# Patient Record
Sex: Female | Born: 1960 | Race: Asian | Hispanic: No | Marital: Married | Smoking: Never smoker
Health system: Southern US, Community
[De-identification: ages and names within clinical notes are randomized; demographics above are authoritative.]

## PROBLEM LIST (undated history)

## (undated) DIAGNOSIS — Z86010 Personal history of colonic polyps: Secondary | ICD-10-CM

## (undated) DIAGNOSIS — I10 Essential (primary) hypertension: Secondary | ICD-10-CM

## (undated) DIAGNOSIS — K219 Gastro-esophageal reflux disease without esophagitis: Secondary | ICD-10-CM

## (undated) DIAGNOSIS — E785 Hyperlipidemia, unspecified: Secondary | ICD-10-CM

## (undated) HISTORY — PX: EYE SURGERY: SHX253

## (undated) HISTORY — PX: BREAST SURGERY: SHX581

## (undated) HISTORY — PX: NOSE SURGERY: SHX723

## (undated) HISTORY — PX: TUBAL LIGATION: SHX77

## (undated) HISTORY — DX: Personal history of colonic polyps: Z86.010

## (undated) HISTORY — DX: Hyperlipidemia, unspecified: E78.5

## (undated) HISTORY — DX: Gastro-esophageal reflux disease without esophagitis: K21.9

## (undated) HISTORY — DX: Essential (primary) hypertension: I10

---

## 2000-05-13 ENCOUNTER — Encounter (INDEPENDENT_AMBULATORY_CARE_PROVIDER_SITE_OTHER): Payer: Self-pay | Admitting: Specialist

## 2000-05-13 ENCOUNTER — Observation Stay (HOSPITAL_COMMUNITY): Admission: RE | Admit: 2000-05-13 | Discharge: 2000-05-14 | Payer: Self-pay | Admitting: Gynecology

## 2000-11-12 ENCOUNTER — Other Ambulatory Visit: Admission: RE | Admit: 2000-11-12 | Discharge: 2000-11-12 | Payer: Self-pay | Admitting: Gynecology

## 2005-08-01 ENCOUNTER — Other Ambulatory Visit: Admission: RE | Admit: 2005-08-01 | Discharge: 2005-08-01 | Payer: Self-pay | Admitting: Gynecology

## 2005-08-15 ENCOUNTER — Encounter: Admission: RE | Admit: 2005-08-15 | Discharge: 2005-08-15 | Payer: Self-pay | Admitting: Gynecology

## 2006-07-01 ENCOUNTER — Emergency Department (HOSPITAL_COMMUNITY): Admission: EM | Admit: 2006-07-01 | Discharge: 2006-07-01 | Payer: Self-pay | Admitting: Emergency Medicine

## 2006-11-30 ENCOUNTER — Emergency Department (HOSPITAL_COMMUNITY): Admission: EM | Admit: 2006-11-30 | Discharge: 2006-11-30 | Payer: Self-pay | Admitting: Emergency Medicine

## 2007-04-17 ENCOUNTER — Other Ambulatory Visit: Admission: RE | Admit: 2007-04-17 | Discharge: 2007-04-17 | Payer: Self-pay | Admitting: Gynecology

## 2008-07-02 IMAGING — CT CT HEAD W/O CM
1 series · 16 of 30 positions shown, 20 images · IV contrast (agent unspecified)
Comparison: None.

CLINICAL DATA: 45-year-old female, right-sided headaches.  
 HEAD CT WITHOUT CONTRAST:
TECHNIQUE: Contiguous axial images were obtained from the base of the skull through the vertex according to standard protocol without contrast.

[Series 2: head_seq 4.5 h45s st · axial · 0.43mm/px · z∈[-111,+33]mm · 16 of 36 slices shown, 20 images]
[im 2/36  brain]
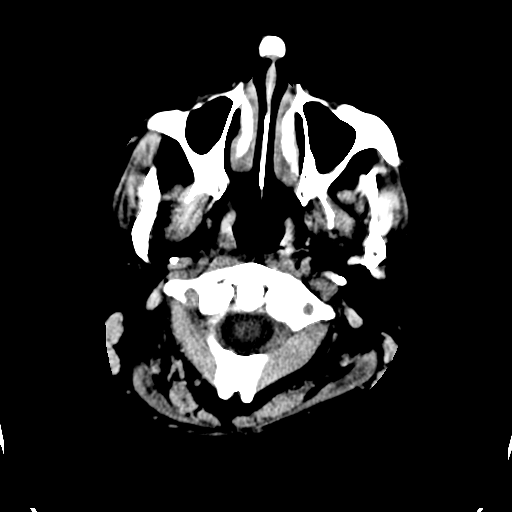
[im 2/36  bone]
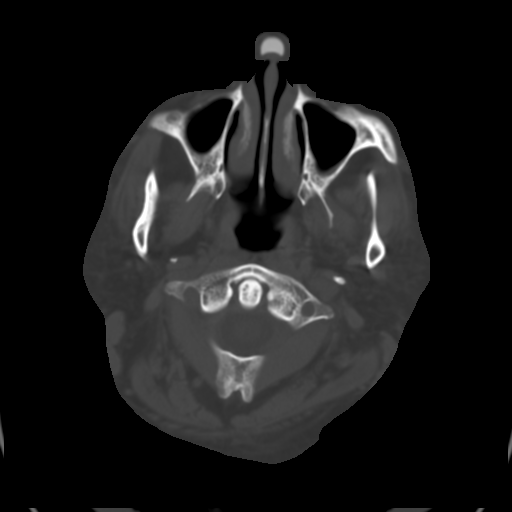
[im 4/36  brain]
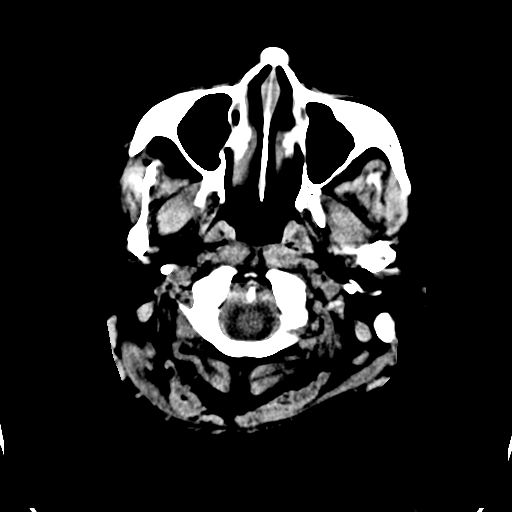
[im 7/36  brain]
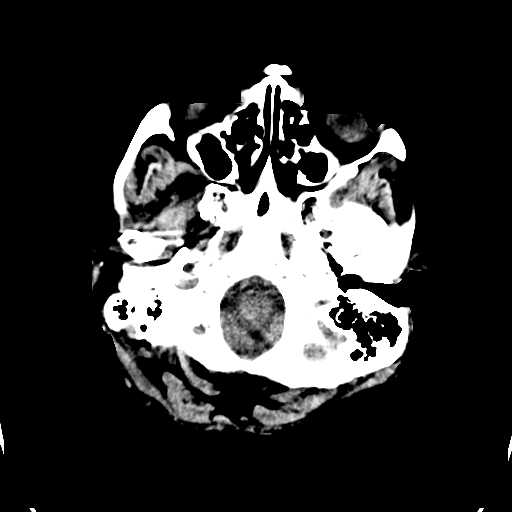
[im 9/36  brain]
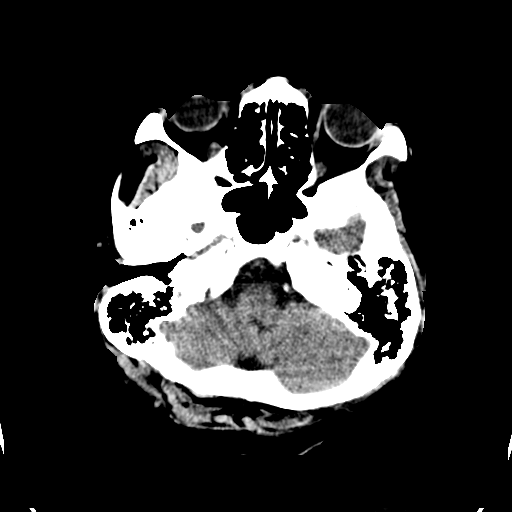
[im 10/36  brain]
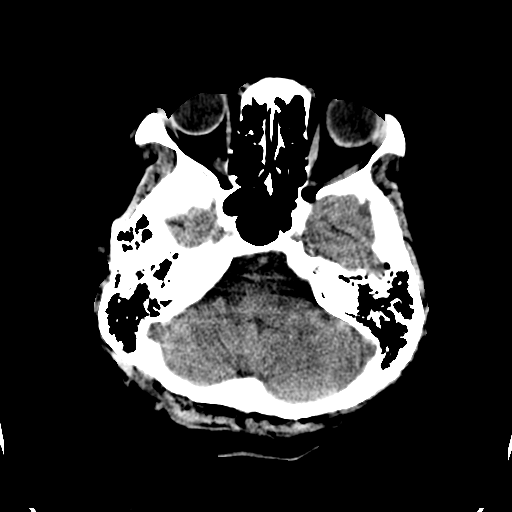
[im 10/36  bone]
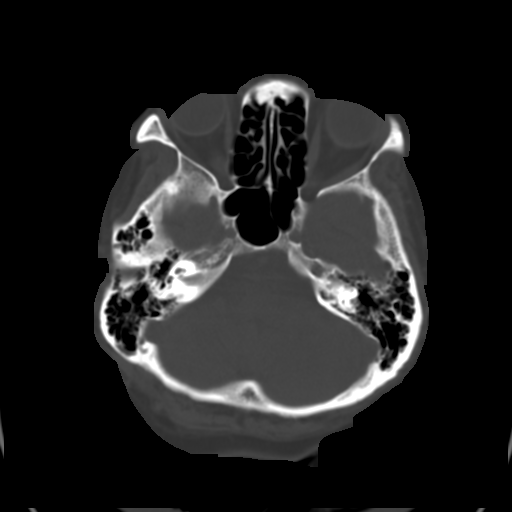
[im 13/36  brain]
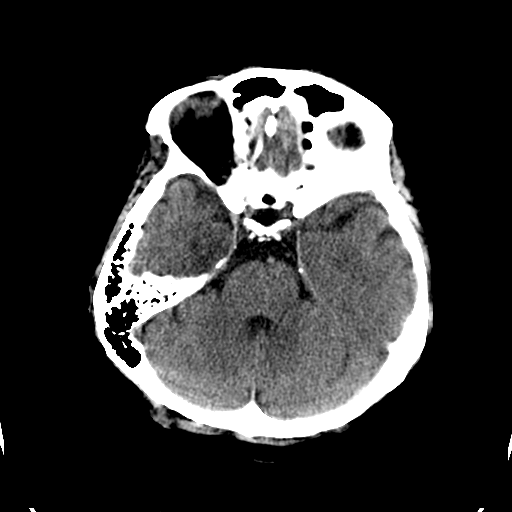
[im 15/36  brain]
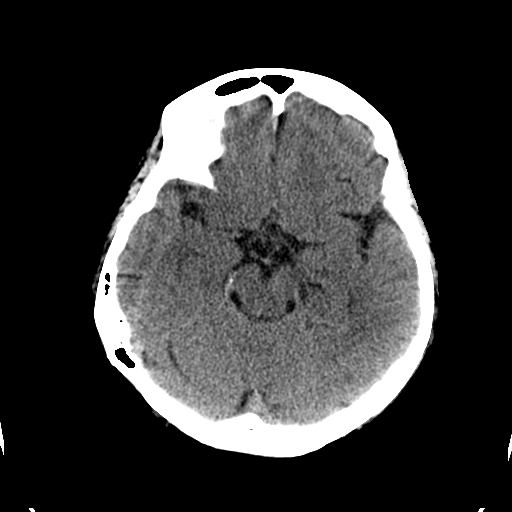
[im 17/36  brain]
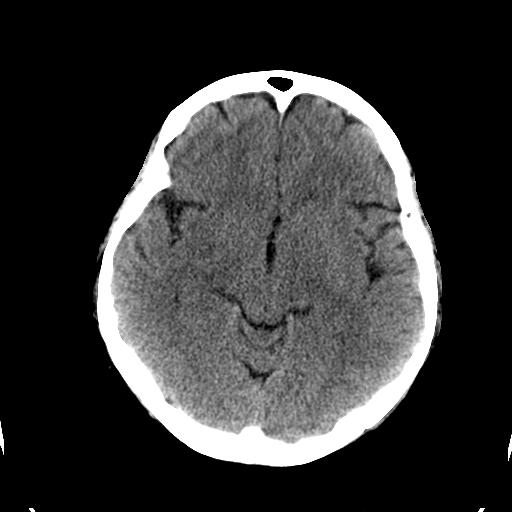
[im 19/36  brain]
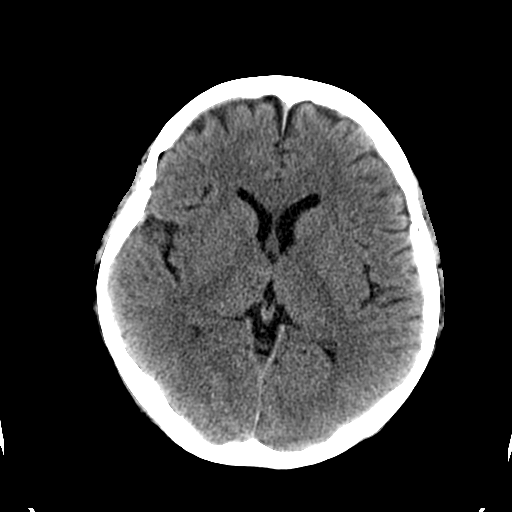
[im 19/36  bone]
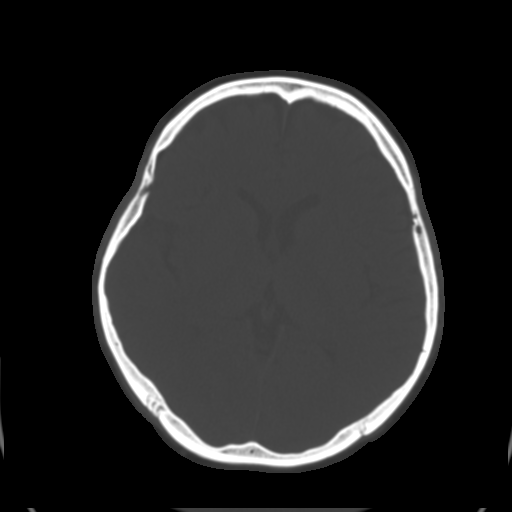
[im 21/36  brain]
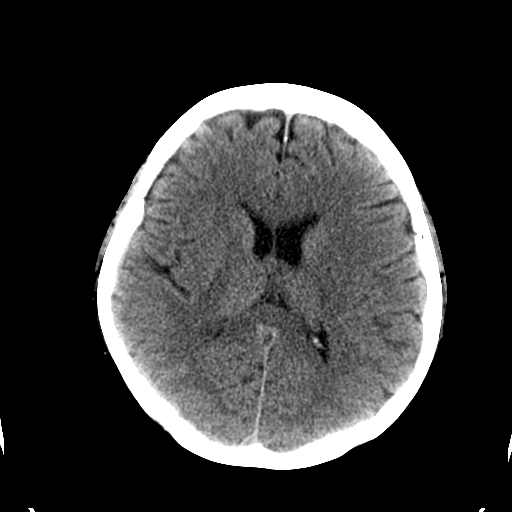
[im 23/36  brain]
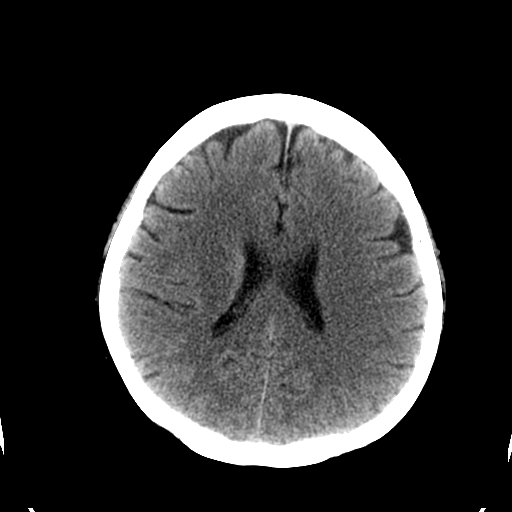
[im 26/36  brain]
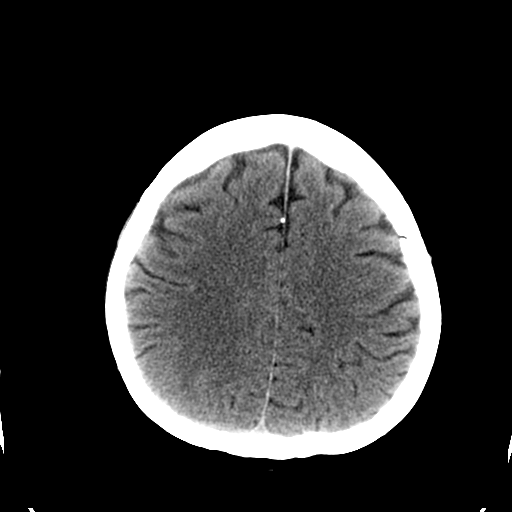
[im 27/36  brain]
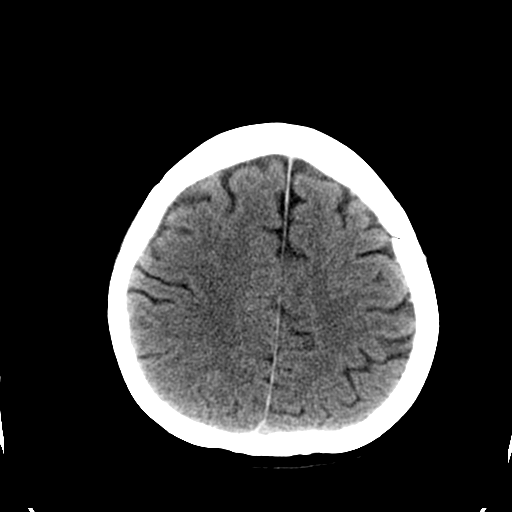
[im 27/36  bone]
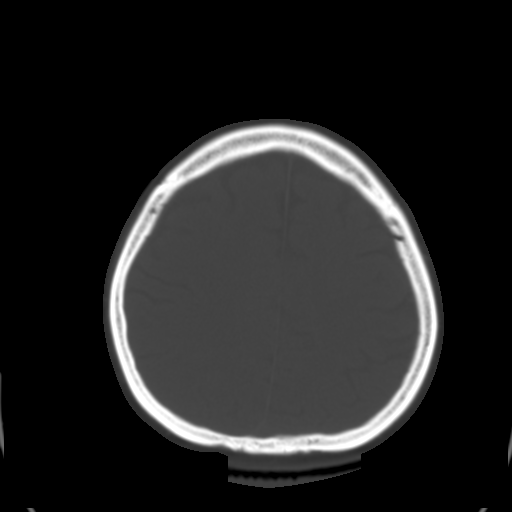
[im 29/36  brain]
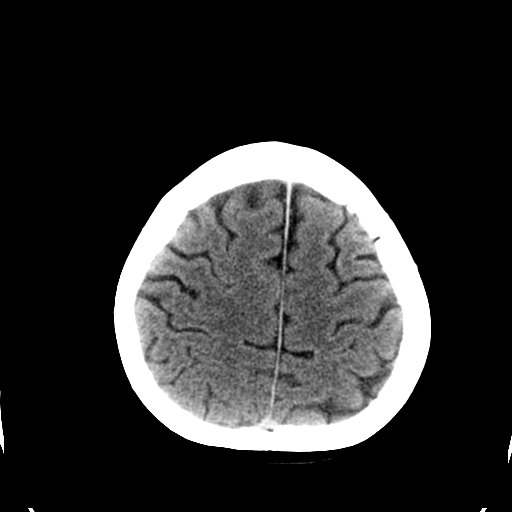
[im 32/36  brain]
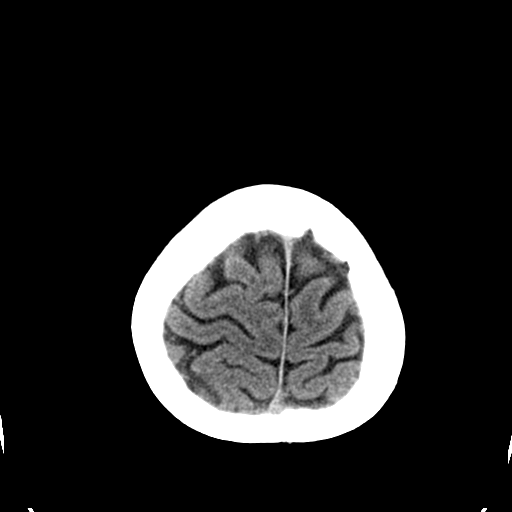
[im 34/36  brain]
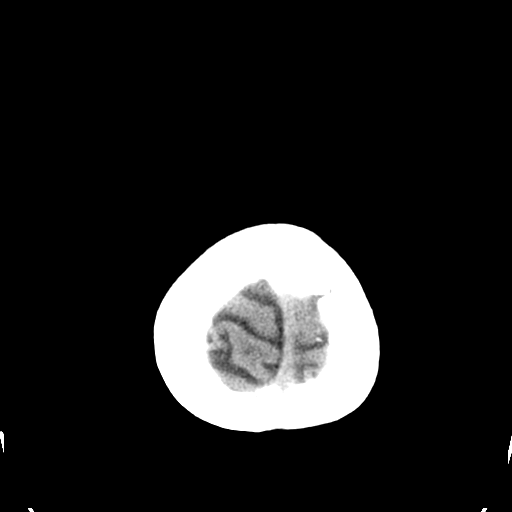

[16 of 30 positions shown; findings below may reference images not displayed]

FINDINGS: No acute intracranial abnormalities are present.  Specifically, there is no evidence for acute infarct, hemorrhage, mass, hydrocephalus, or extra-axial fluid collection.  Extra-axial soft tissues are within normal limits apart from a nasal implant.  The paranasal sinuses and mastoid air cells are clear.
IMPRESSION: No acute intracranial abnormality.

## 2009-01-10 ENCOUNTER — Encounter: Admission: RE | Admit: 2009-01-10 | Discharge: 2009-01-10 | Payer: Self-pay | Admitting: Gastroenterology

## 2009-02-02 ENCOUNTER — Ambulatory Visit (HOSPITAL_COMMUNITY): Admission: RE | Admit: 2009-02-02 | Discharge: 2009-02-02 | Payer: Self-pay | Admitting: Gastroenterology

## 2010-03-14 ENCOUNTER — Encounter: Admission: RE | Admit: 2010-03-14 | Discharge: 2010-03-14 | Payer: Self-pay | Admitting: Gynecology

## 2010-03-19 ENCOUNTER — Encounter: Admission: RE | Admit: 2010-03-19 | Discharge: 2010-03-19 | Payer: Self-pay | Admitting: Gynecology

## 2010-05-23 ENCOUNTER — Encounter
Admission: RE | Admit: 2010-05-23 | Discharge: 2010-05-23 | Payer: Self-pay | Source: Home / Self Care | Attending: General Surgery | Admitting: General Surgery

## 2010-05-23 ENCOUNTER — Ambulatory Visit
Admission: RE | Admit: 2010-05-23 | Discharge: 2010-05-23 | Payer: Self-pay | Source: Home / Self Care | Attending: General Surgery | Admitting: General Surgery

## 2010-06-17 ENCOUNTER — Encounter: Payer: Self-pay | Admitting: Gynecology

## 2010-08-06 LAB — CBC
Hemoglobin: 13.3 g/dL (ref 12.0–15.0)
MCHC: 33.3 g/dL (ref 30.0–36.0)
Platelets: 298 10*3/uL (ref 150–400)
RDW: 16.5 % — ABNORMAL HIGH (ref 11.5–15.5)

## 2010-08-06 LAB — DIFFERENTIAL
Basophils Absolute: 0 10*3/uL (ref 0.0–0.1)
Basophils Relative: 1 % (ref 0–1)
Monocytes Relative: 9 % (ref 3–12)
Neutro Abs: 2.9 10*3/uL (ref 1.7–7.7)
Neutrophils Relative %: 47 % (ref 43–77)

## 2010-08-06 LAB — BASIC METABOLIC PANEL
Calcium: 9.1 mg/dL (ref 8.4–10.5)
Creatinine, Ser: 0.68 mg/dL (ref 0.4–1.2)
GFR calc non Af Amer: >60 mL/min (ref >60)
Glucose, Bld: 86 mg/dL (ref 70–99)
Sodium: 139 mEq/L (ref 135–145)

## 2010-10-12 NOTE — Op Note (Signed)
[facility]  Patient:    Cynthia Spence, Cynthia Spence                    MRN: [id number] Proc. Date: 05/13/00 Adm. Date:  [id number] Attending:  Teodora Medici Cabitt                           Operative Report  PREOPERATIVE DIAGNOSIS:       Previous sterilization.  POSTOPERATIVE DIAGNOSES:      1. Previous sterilization.                               2. Adhesions.                               3. Endometriosis.  PROCEDURE:                    Bilateral microscopic isthmic tubal anastomosis.  SURGEON:                      Leatha Gilding. Mezer, M.D.  ASSISTANT:                    Harl Bowie, M.D.  ANESTHESIA:                   Endotracheal.  PREPARATION:                  Betadine.  ESTIMATED BLOOD LOSS:         Approximately 100 cc.  DESCRIPTION OF PROCEDURE:     With the patient in the supine position, she was prepped and draped in the routine fashion. A pediatric Foley catheter had been placed through the cervix. A Pfannenstiel incision was then made in the skin and subcutaneous tissue. The fascia and peritoneum were opened without difficulty. Exploration of her upper abdomen was benign. Exploration of the pelvis revealed the uterus to be top normal in size with adhesions of bowel to the left round ligament which were taken down in layers. Both distal segments were approximately 5 cm long with adequate fimbria. The ovaries were normal bilaterally. There were several deep nodules of endometriosis in the right broad ligament at the tubal ligation site. These were entered with cautery and drained. The nodules were not excised because of concern of damage to the mesosalpinx. There was no other visible endometriosis. Both proximal segments were less than a centimeter and bound to the mesosalpinx. Microcautery was used to elevate the proximal segments. The serosa was opened and the distal portion of the proximal tubes excised with scissors. The tube appeared to  be quite healthy. Indigo carmine dye was injected transcervically and there was no spill on either side. A #2 polyethylene suture was easily passed from the segment of tube into the uterus and it was felt that there was a failure of the dye transfer system and a rather than lack of patency of the fallopian tube. On the right side the #3 lacrimal duct probe was easily passed. The serosa of the tube opened and the proximal segment of the distal tube excised with scissors. The tube appeared to be extremely healthy and a stent suture was placed with a pediatric feeding tube. The mesosalpinx was approximated with running the 6-0 Vicryl suture and then with the  aid of the microscope, the 8-0 Vicryl sutures remained to perform an isthmic-isthmic tubal anastomosis with sutures at cardinal points. The serosa was then reapproximated with interrupted 6-0 Vicryl suture. The approximation was excellent. On the left side, the distal segment was prepared differently as the tube was a bit more convoluted at the isthmic portion of the tube, and it was felt that trying to pass the lacrimal duct probe would more likely cause damage to the tube then the benefit of the stent would be. The serosa of the tube was opened. The tubal lumen transected in the isthmic portion. Retrograde dye was passed from the fimbria with an excellent stream. The tube appeared to be very healthy. The microscope was again employed and the two isthmic sections of tube physically approximated and the 8-0 Vicryl suture was used to placed sutures at cardinal points to perform an excellent anastomosis. The serosa was then approximated with interrupted 6-0 Vicryl and then the mesosalpinx was approximated with a running 6-0 Vicryl suture. Throughout the procedure the tissue was kept moist.  At the completion of the procedure, the pelvis was irrigated with copious amounts of warm lactated Ringers solution and aspirated. At the completion  of the procedure, a nick was made to placed in the large bowel in the cul-de-sac. The omentum was brought down and the abdomen was closed in layers using a running 2-0 Vicryl on the peritoneum. Careful attention was paid to hemostasis as there was a fair amount of subfascial bleeding during the opening of the case and then after the retractor was removed. The fascia was approximated with running 0 Vicryl to the midline bilaterally. Hemostasis was assured in the subcutaneous tissue and the skin was closed with staples. The estimated blood loss was approximately 100 cc. The sponge, instrument, and needle counts were correct x 2. The patient tolerated the procedure well and was taken to recovery room in satisfactory condition. DD:  05/13/00 TD:  05/14/00 Job: [id number]

## 2011-06-25 ENCOUNTER — Ambulatory Visit: Payer: BC Managed Care – PPO

## 2011-06-25 ENCOUNTER — Ambulatory Visit (INDEPENDENT_AMBULATORY_CARE_PROVIDER_SITE_OTHER): Payer: BC Managed Care – PPO | Admitting: Family Medicine

## 2011-06-25 VITALS — BP 164/94 | HR 60 | Temp 99.2°F | Resp 16 | Ht 63.75 in | Wt 163.6 lb

## 2011-06-25 DIAGNOSIS — M25539 Pain in unspecified wrist: Secondary | ICD-10-CM

## 2011-06-25 DIAGNOSIS — M25532 Pain in left wrist: Secondary | ICD-10-CM

## 2011-06-25 DIAGNOSIS — I10 Essential (primary) hypertension: Secondary | ICD-10-CM | POA: Insufficient documentation

## 2011-06-25 MED ORDER — METHYLPREDNISOLONE ACETATE 80 MG/ML IJ SUSP: 80.0000 mg | Freq: Once | INTRAMUSCULAR | Status: DC

## 2011-06-25 MED ORDER — METHYLPREDNISOLONE ACETATE 80 MG/ML IJ SUSP
80.0000 mg | Freq: Once | INTRAMUSCULAR | Status: AC
  Administered 2011-06-25: 80 mg via INTRA_ARTICULAR

## 2011-06-25 NOTE — Patient Instructions (Addendum)
De Quervain's Disease Suzette Battiest disease is a condition often seen in racquet sports where there is a soreness (inflammation) in the cord like structures (tendons) which attach muscle to bone on the thumb side of the wrist. There may be a tightening of the tissuesaround the tendons. This condition is often helped by giving up or modifying the activity which caused it. When conservative treatment does not help, surgery may be required. Conservative treatment could include changes in the activity which brought about the problem or made it worse. Anti-inflammatory medications and injections may be used to help decrease the inflammation and help with pain control. Your caregiver will help you determine which is best for you. DIAGNOSIS  Often the diagnosis (learning what is wrong) can be made by examination. Sometimes x-rays are required. HOME CARE INSTRUCTIONS   Apply ice to the sore area for 15 to 20 minutes, 3 to 4 times per day while awake. Put the ice in a plastic bag and place a towel between the bag of ice and your skin. This is especially helpful if it can be done after all activities involving the sore wrist.   Temporary splinting may help.   Only take over-the-counter or prescription medicines for pain, discomfort or fever as directed by your caregiver.  SEEK MEDICAL CARE IF:   Pain relief is not obtained with medications, or if you have increasing pain and seem to be getting worse rather than better.  MAKE SURE YOU:   Understand these instructions.   Will watch your condition.   Will get help right away if you are not doing well or get worse.  Document Released: 02/05/2001 Document Revised: 01/23/2011 Document Reviewed: 05/13/2005 [facility] Patient Information 2012 [place], [place].   Please return in a week if pain has not completely resolved.

## 2011-06-25 NOTE — Progress Notes (Signed)
  Subjective:    Patient ID: Cynthia Spence, female    DOB: May 24, 1961, 51 y.o.   MRN: [id number]  Wrist Pain  The pain is present in the left wrist. This is a new problem. The current episode started more than 1 month ago. There has been no history of extremity trauma. The problem occurs constantly. The problem has been unchanged. The quality of the pain is described as aching, burning and sharp. The pain is at a severity of 5/10. The pain is moderate. Pertinent negatives include no fever, itching, joint locking, limited range of motion or numbness. The symptoms are aggravated by activity. She has tried NSAIDS and OTC pain meds for the symptoms. The treatment provided moderate relief. Family history does not include gout or rheumatoid arthritis. Her past medical history is significant for gout. There is no history of diabetes, osteoarthritis or rheumatoid arthritis.      Review of Systems  Constitutional: Negative for fever.  Musculoskeletal: Positive for gout.  Skin: Negative for itching.  Neurological: Negative for numbness.  There is no history of gout..A user error has taken place: charting done on wrong patient and has been corrected. above     Objective:   Physical Exam  Constitutional: She is oriented to person, place, and time. She appears well-developed and well-nourished.  HENT:  Head: Normocephalic and atraumatic.  Eyes: Conjunctivae and EOM are normal. Pupils are equal, round, and reactive to light.  Neck: Normal range of motion. Neck supple.  Musculoskeletal: Normal range of motion.  Neurological: She is alert and oriented to person, place, and time.  Skin: Skin is warm.  Very tender left distal radius with no swelling  Patient injected with marcaine and depomedrol at distal radius, thumb side after sterile prep.  Good pain relief.  No complications    X-ray of wrist is negative    Assessment & Plan:  DeQuervain's synovitis

## 2013-09-02 ENCOUNTER — Ambulatory Visit (INDEPENDENT_AMBULATORY_CARE_PROVIDER_SITE_OTHER): Payer: Commercial Managed Care - PPO | Admitting: Physician Assistant

## 2013-09-02 VITALS — BP 124/82 | HR 65 | Temp 98.0°F | Resp 18 | Ht 63.0 in | Wt 157.0 lb

## 2013-09-02 DIAGNOSIS — K219 Gastro-esophageal reflux disease without esophagitis: Secondary | ICD-10-CM

## 2013-09-02 DIAGNOSIS — E7800 Pure hypercholesterolemia, unspecified: Secondary | ICD-10-CM | POA: Insufficient documentation

## 2013-09-02 DIAGNOSIS — I10 Essential (primary) hypertension: Secondary | ICD-10-CM

## 2013-09-02 DIAGNOSIS — R1310 Dysphagia, unspecified: Secondary | ICD-10-CM

## 2013-09-02 LAB — POCT CBC
GRANULOCYTE PERCENT: 58.6 % (ref 37–80)
HCT, POC: 44.3 % (ref 37.7–47.9)
Hemoglobin: 13.9 g/dL (ref 12.2–16.2)
Lymph, poc: 2.1 (ref 0.6–3.4)
MCH, POC: 30.5 pg (ref 27–31.2)
MCHC: 31.4 g/dL — AB (ref 31.8–35.4)
MCV: 97.3 fL — AB (ref 80–97)
MID (CBC): 0.4 (ref 0–0.9)
MPV: 8.3 fL (ref 0–99.8)
PLATELET COUNT, POC: 307 10*3/uL (ref 142–424)
POC Granulocyte: 3.63 (ref 2–6.9)
POC LYMPH %: 34.4 % (ref 10–50)
POC MID %: 7 % (ref 0–12)
RBC: 4.55 M/uL (ref 4.04–5.48)
RDW, POC: 14.2 %
WBC: 6.1 10*3/uL (ref 4.6–10.2)

## 2013-09-02 MED ORDER — RANITIDINE HCL 300 MG PO TABS: 300.0000 mg | ORAL_TABLET | Freq: Every day | ORAL | Status: DC

## 2013-09-02 MED ORDER — ATENOLOL 50 MG PO TABS: 50.0000 mg | ORAL_TABLET | Freq: Every day | ORAL | Status: DC

## 2013-09-02 MED ORDER — ATORVASTATIN CALCIUM 20 MG PO TABS: 20.0000 mg | ORAL_TABLET | Freq: Every day | ORAL | Status: DC

## 2013-09-02 MED ORDER — LANSOPRAZOLE 30 MG PO CPDR: 30.0000 mg | DELAYED_RELEASE_CAPSULE | Freq: Two times a day (BID) | ORAL | Status: DC

## 2013-09-02 MED ORDER — SUCRALFATE 1 GM/10ML PO SUSP: 1.0000 g | Freq: Three times a day (TID) | ORAL | Status: DC

## 2013-09-02 NOTE — Progress Notes (Signed)
Subjective:    Patient ID: Cynthia Spence, female    DOB: [birth year], 53 y.o.   MRN: [id number]  HPI  Pt presents to clinic for med refills for there HTN and cholesterol and reflux issues.  She has been a patient at [facility] and she thinks that she is going to change her care to here because it is easier and she needs med refills.  She has never had an EKG.  She does not check her BP at home.  She is not sure what her cholesterol readings were at her last labs.  She has had reflux in the past and was placed on Nexium and it did not work - and then she was placed on prilosec and that helped a little bit more but she has been off for several months - she also has h/o esophageal stricture that was stretched in 2010 and she has not had problems since.  This feels similar to when she had the stricture but then she did not have the burning sensation that she has now.  She has worse symptoms when she lays down at night and after she drinks coffee - she has done nothing OTC for her symptoms.  Review of Systems  Constitutional: Negative for fever and chills.  HENT: Positive for sore throat.   Respiratory: Negative for cough.   Cardiovascular: Negative for chest pain.  Gastrointestinal: Negative for diarrhea.       Objective:   Physical Exam  Vitals reviewed. Constitutional: She is oriented to person, place, and time. She appears well-developed and well-nourished.  HENT:  Head: Normocephalic and atraumatic.  Right Ear: External ear normal.  Left Ear: External ear normal.  Cardiovascular: Normal rate, regular rhythm and normal heart sounds.   Pulmonary/Chest: Effort normal and breath sounds normal. She has no wheezes.  Abdominal: There is tenderness (epigastric TTP) in the epigastric area. There is no rebound, no guarding and negative Murphy's sign.  Neurological: She is alert and oriented to person, place, and time.  Skin: Skin is warm and dry.  Psychiatric: She has a normal mood and affect.  Her behavior is normal. Judgment and thought content normal.   Results for orders placed in visit on 09/02/13  POCT CBC      Result Value Ref Range   WBC 6.1  4.6 - 10.2 K/uL   Lymph, poc 2.1  0.6 - 3.4   POC LYMPH PERCENT 34.4  10 - 50 %L   MID (cbc) 0.4  0 - 0.9   POC MID % 7.0  0 - 12 %M   POC Granulocyte 3.63  2 - 6.9   Granulocyte percent 58.6  37 - 80 %G   RBC 4.55  4.04 - 5.48 M/uL   Hemoglobin 13.9  12.2 - 16.2 g/dL   HCT, POC 44.3  37.7 - 47.9 %   MCV 97.3 (*) 80 - 97 fL   MCH, POC 30.5  27 - 31.2 pg   MCHC 31.4 (*) 31.8 - 35.4 g/dL   RDW, POC 14.2     Platelet Count, POC 307  142 - 424 K/uL   MPV 8.3  0 - 99.8 fL       Assessment & Plan:  Hypertension - Plan: COMPLETE METABOLIC PANEL WITH GFR, atenolol (TENORMIN) 50 MG tablet  Esophageal reflux - Plan: H. pylori antibody, IgG, Ambulatory referral to Gastroenterology, lansoprazole (PREVACID) 30 MG capsule, ranitidine (ZANTAC) 300 MG tablet, sucralfate (CARAFATE) 1 GM/10ML suspension  Hypercholesterolemia - Plan:  COMPLETE METABOLIC PANEL WITH GFR, Lipid panel, atorvastatin (LIPITOR) 20 MG tablet  Dysphagia - Plan: H. pylori antibody, IgG, POCT CBC, Ambulatory referral to Gastroenterology  We refilled her continuity medications for her HTN and cholesterol problems.  She will f/u in 3 months.  We will treat her reflux while waiting on a referral for evaluation for repeat stricture.  She is having some burning so I think that there is definitely a component of reflux to her symptoms.  She will f/u with me or GI in a month -   Windell Hummingbird PA-C  [facility] 09/02/2013 6:59 PM

## 2013-09-03 LAB — COMPLETE METABOLIC PANEL WITH GFR
ALT: 52 U/L — AB (ref 0–35)
AST: 44 U/L — ABNORMAL HIGH (ref 0–37)
Albumin: 4.5 g/dL (ref 3.5–5.2)
Alkaline Phosphatase: 90 U/L (ref 39–117)
BILIRUBIN TOTAL: 0.6 mg/dL (ref 0.2–1.2)
BUN: 15 mg/dL (ref 6–23)
CALCIUM: 10 mg/dL (ref 8.4–10.5)
CHLORIDE: 97 meq/L (ref 96–112)
CO2: 32 meq/L (ref 19–32)
CREATININE: 0.68 mg/dL (ref 0.50–1.10)
GLUCOSE: 96 mg/dL (ref 70–99)
Potassium: 3.5 mEq/L (ref 3.5–5.3)
Sodium: 138 mEq/L (ref 135–145)
Total Protein: 7.5 g/dL (ref 6.0–8.3)

## 2013-09-03 LAB — LIPID PANEL
CHOLESTEROL: 215 mg/dL — AB (ref 0–200)
HDL: 79 mg/dL (ref >39)
LDL CALC: 89 mg/dL (ref 0–99)
TRIGLYCERIDES: 236 mg/dL — AB (ref <150)
Total CHOL/HDL Ratio: 2.7 Ratio
VLDL: 47 mg/dL — ABNORMAL HIGH (ref 0–40)

## 2013-09-06 ENCOUNTER — Encounter: Payer: Self-pay | Admitting: Internal Medicine

## 2013-09-06 LAB — H. PYLORI ANTIBODY, IGG: H Pylori IgG: 0.5 {ISR}

## 2013-09-06 NOTE — Progress Notes (Signed)
Left a message for patient to return call for a 3 month appointment.

## 2013-10-01 ENCOUNTER — Encounter: Payer: Self-pay | Admitting: Gastroenterology

## 2013-10-13 ENCOUNTER — Encounter: Payer: Self-pay | Admitting: Internal Medicine

## 2013-10-13 ENCOUNTER — Ambulatory Visit (INDEPENDENT_AMBULATORY_CARE_PROVIDER_SITE_OTHER): Payer: Commercial Managed Care - PPO | Admitting: Internal Medicine

## 2013-10-13 VITALS — BP 120/78 | HR 60 | Ht 63.0 in | Wt 162.0 lb

## 2013-10-13 DIAGNOSIS — R1314 Dysphagia, pharyngoesophageal phase: Secondary | ICD-10-CM

## 2013-10-13 DIAGNOSIS — Z1211 Encounter for screening for malignant neoplasm of colon: Secondary | ICD-10-CM

## 2013-10-13 MED ORDER — MOVIPREP 100 G PO SOLR: ORAL | Status: DC

## 2013-10-13 NOTE — Patient Instructions (Addendum)
You have been scheduled for an endoscopy and colonoscopy with propofol. Please follow the written instructions given to you at your visit today. Please pick up your prep at the pharmacy within the next 1-3 days. If you use inhalers (even only as needed), please bring them with you on the day of your procedure. Your physician has requested that you go to www.startemmi.com and enter the access code given to you at your visit today. This web site gives a general overview about your procedure. However, you should still follow specific instructions given to you by our office regarding your preparation for the procedure.  I appreciate the opportunity to care for you.

## 2013-10-13 NOTE — Progress Notes (Signed)
Patient ID: Cynthia Spence, female   DOB: 1961-05-20, 53 y.o.   MRN: [id number]     Consultation  Referring Provider/Primary Care Physician:  Elizabeth Sauer          Reason for Consultation:     Dysphagia     Impression / Plan:   Dysphagia, pharyngoesophageal phase  Special screening for malignant neoplasms, colon  1) Continue current tx 2) EGD, possible dilation 3) colonoscopy - screening 4) The risks and benefits as well as alternatives of endoscopic procedure(s) have been discussed and reviewed. All questions answered. The patient agrees to proceed.           HPI:   Cynthia Spence is a 53 y.o. female referred by Ms. Weber because of dysphagia. She has had several months of intermittent solid dysphagia, heartburn and maybe some odynophagia. She describes a mid-sternal sticking point. Does not regurgitate, waits for bolus to pass. No weight loss. Better after Tx w/ PPI, sucralfate and H2B.  Occasional constipation. GI ROS o/w ok.  Past Medical History  Diagnosis Date  . Hypertension   . GERD (gastroesophageal reflux disease)   . HLD (hyperlipidemia)     Past Surgical History  Procedure Laterality Date  . Breast surgery Left     tumor  . Nose surgery    . Eye surgery    . Tubal ligation      Family History  Problem Relation Age of Onset  . Stomach cancer Mother     ?     History  Substance Use Topics  . Smoking status: Never Smoker   . Smokeless tobacco: Never Used  . Alcohol Use: Yes     Comment: 2-3 per month    Prior to Admission medications   Medication Sig Start Date End Date Taking? Authorizing Provider  atenolol (TENORMIN) 50 MG tablet Take 1 tablet (50 mg total) by mouth daily. 09/02/13  Yes Mancel Bale, PA-C  atorvastatin (LIPITOR) 20 MG tablet Take 1 tablet (20 mg total) by mouth daily. 09/02/13  Yes Mancel Bale, PA-C  lansoprazole (PREVACID) 30 MG capsule Take 1 capsule (30 mg total) by mouth 2 (two) times daily before a meal.  09/02/13  Yes Mancel Bale, PA-C  ranitidine (ZANTAC) 300 MG tablet Take 1 tablet (300 mg total) by mouth at bedtime. 09/02/13  Yes Mancel Bale, PA-C  sucralfate (CARAFATE) 1 GM/10ML suspension Take 10 mLs (1 g total) by mouth 4 (four) times daily -  with meals and at bedtime. 09/02/13  Yes Mancel Bale, PA-C    Current Outpatient Prescriptions  Medication Sig Dispense Refill  . atenolol (TENORMIN) 50 MG tablet Take 1 tablet (50 mg total) by mouth daily.  30 tablet  2  . atorvastatin (LIPITOR) 20 MG tablet Take 1 tablet (20 mg total) by mouth daily.  30 tablet  2  . lansoprazole (PREVACID) 30 MG capsule Take 1 capsule (30 mg total) by mouth 2 (two) times daily before a meal.  60 capsule  0  . ranitidine (ZANTAC) 300 MG tablet Take 1 tablet (300 mg total) by mouth at bedtime.  30 tablet  0  . sucralfate (CARAFATE) 1 GM/10ML suspension Take 10 mLs (1 g total) by mouth 4 (four) times daily -  with meals and at bedtime.  420 mL  0       Allergies as of 10/13/2013  . (No Known Allergies)    History   Social History  . Marital Status:  Married    Spouse Name: N/A    Number of Children: 3  . Years of Education: N/A   Occupational History  .     Social History Main Topics  . Smoking status: Never Smoker   . Smokeless tobacco: Never Used  . Alcohol Use: Yes     Comment: 2-3 per month  . Drug Use: No  . Sexual Activity: Not on file   Other Topics Concern  . 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       Physical Exam:   BP 120/78  Pulse 60  Ht _0  (1.6 m)  Wt 162 lb (73.483 kg)  BMI 28.70 kg/m2     General:  Well-developed, well-nourished and in no acute distress Eyes:  anicteric. ENT:   Mouth and posterior pharynx  free of lesions.  Neck:   supple w/o thyromegaly or mass.  Lungs: Clear to auscultation bilaterally. Heart:  S1S2, no rubs, murmurs, gallops. Abdomen:  soft, non-tender, no hepatosplenomegaly, hernia, or mass and BS+.  Rectal: deferred Lymph:  no cervical or supraclavicular adenopathy. Extremities:   no edema Skin   no rash. Neuro:  A&O x 3.  Psych:  appropriate mood and  Affect.   Data Reviewed:  [facility] note   Thanks  Gatha Mayer, MD, [facility]    10/13/2013, 9:00 AM Cc;WEBER,SARAH, PA-C

## 2013-10-19 ENCOUNTER — Ambulatory Visit (AMBULATORY_SURGERY_CENTER): Payer: Commercial Managed Care - PPO | Admitting: Internal Medicine

## 2013-10-19 ENCOUNTER — Encounter: Payer: Self-pay | Admitting: Internal Medicine

## 2013-10-19 VITALS — BP 119/73 | HR 66 | Temp 96.0°F | Resp 16 | Ht 63.0 in | Wt 162.0 lb

## 2013-10-19 DIAGNOSIS — R1314 Dysphagia, pharyngoesophageal phase: Secondary | ICD-10-CM

## 2013-10-19 DIAGNOSIS — Z1211 Encounter for screening for malignant neoplasm of colon: Secondary | ICD-10-CM

## 2013-10-19 DIAGNOSIS — D126 Benign neoplasm of colon, unspecified: Secondary | ICD-10-CM

## 2013-10-19 DIAGNOSIS — D130 Benign neoplasm of esophagus: Secondary | ICD-10-CM

## 2013-10-19 MED ORDER — SODIUM CHLORIDE 0.9 % IV SOLN: 500.0000 mL | INTRAVENOUS | Status: DC

## 2013-10-19 NOTE — Op Note (Signed)
[place]  [address] [place], [address]   COLONOSCOPY PROCEDURE REPORT  PATIENT: Cynthia Spence, Cynthia Spence  MR#: [id number] BIRTHDATE: [birth year] , 52  yrs. old GENDER: Female ENDOSCOPIST: Gatha Mayer, MD, [facility] REFERRED UE:AVWUJ Weber, M.D. PROCEDURE DATE:  10/19/2013 PROCEDURE:   Colonoscopy with snare polypectomy First Screening Colonoscopy - Avg.  risk and is 50 yrs.  old or older Yes.  Prior Negative Screening - Now for repeat screening. N/A  History of Adenoma - Now for follow-up colonoscopy & has been > or = to 3 yrs.  N/A  Polyps Removed Today? Yes. ASA CLASS:   Class II INDICATIONS:average risk screening and first colonoscopy. MEDICATIONS: There was residual sedation effect present from prior procedure, propofol (Diprivan) 100mg  IV, MAC sedation, administered by CRNA, and These medications were titrated to patient response per physician's verbal order  DESCRIPTION OF PROCEDURE:   After the risks benefits and alternatives of the procedure were thoroughly explained, informed consent was obtained.  A digital rectal exam revealed no abnormalities of the rectum.   The LB [id number]  endoscope was introduced through the anus and advanced to the cecum, which was identified by both the appendix and ileocecal valve. No adverse events experienced.   The quality of the prep was excellent, using MoviPrep  The instrument was then slowly withdrawn as the colon was fully examined.    COLON FINDINGS: A sessile polyp measuring 5 mm in size was found in the ascending colon.  A polypectomy was performed with a cold snare.  The resection was complete and the polyp tissue was completely retrieved.   The colon mucosa was otherwise normal.   A right colon retroflexion was performed.  Retroflexed views revealed no abnormalities. The time to cecum=1 minutes 36 seconds. Withdrawal time=8 minutes 05 seconds.  The scope was withdrawn and the procedure  completed. COMPLICATIONS: There were no complications.  ENDOSCOPIC IMPRESSION: 1.   Sessile polyp measuring 5 mm in size was found in the ascending colon; polypectomy was performed with a cold snare 2.   The colon mucosa was otherwise normal - excellent prep - first screening  RECOMMENDATIONS: Timing of repeat colonoscopy will be determined by pathology findings.   eSigned:  Gatha Mayer, MD, [facility] 10/19/2013 11:59 AM   cc: Bernita Buffy PA and The Patient

## 2013-10-19 NOTE — Op Note (Signed)
[place]  [address] [place], [address]   ENDOSCOPY PROCEDURE REPORT  PATIENT: Cynthia, Spence  MR#: [id number] BIRTHDATE: 16-Nov-1960 , 52  yrs. old GENDER: Female ENDOSCOPIST: Gatha Mayer, MD, [facility] REFERRED BY:  Windell Hummingbird, M.D. PROCEDURE DATE:  10/19/2013 PROCEDURE:  EGD w/ biopsy and Maloney dilation of esophagus ASA CLASS:     Class II INDICATIONS:  Dysphagia. MEDICATIONS: propofol (Diprivan) 200mg  IV, MAC sedation, administered by CRNA, and These medications were titrated to patient response per physician's verbal order TOPICAL ANESTHETIC: Cetacaine Spray  DESCRIPTION OF PROCEDURE: After the risks benefits and alternatives of the procedure were thoroughly explained, informed consent was obtained.  The LB [id number] endoscope was introduced through the mouth and advanced to the second portion of the duodenum. Without limitations.  The instrument was slowly withdrawn as the mucosa was fully examined.    ESOPHAGUS: Changes of possible eosinophilic esophagitis with mucosal changes that included felinization of the esophagus and longitudinal furrows were found in the entire esophagus.   Biospies taken of entire esophagus.  The remainder of the upper endoscopy exam was otherwise normal. Retroflexed views revealed no abnormalities.     The scope was then withdrawn from the patient, a 17 Fr Maloney dilation was performed with slight resistance and slight heme, re-inspection w/o tears, and the procedure completed.  COMPLICATIONS: There were no complications. ENDOSCOPIC IMPRESSION: 1.   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   The remainder of the upper endoscopy exam was otherwise normal - 22 Fr Maloney dilation performed to treat dysphagia  RECOMMENDATIONS: 1.  Office will call with results 2.  Clear liquids until 1 PM , then soft foods rest of day.  Resume prior  diet tomorrow.   eSigned:  Gatha Mayer, MD, [facility] 10/19/2013 11:54 AM   VE:HMCNOB, Judson Roch PA and The Patient

## 2013-10-19 NOTE — Patient Instructions (Addendum)
I took biopsies of the esophagus to see if you have a condition called eosinophilic esophagitis - and then I dilated the esophagus. I hope this helps and I will be in touch about the biopsy results and plans.  I found and removed one tiny colon polyp that looks benign. This may mean repeating a colonoscopy before 10 years. I will let you know.  I appreciate the opportunity to care for you. Gatha Mayer, MD, FACG  YOU HAD AN ENDOSCOPIC PROCEDURE TODAY AT [facility]: Refer to the procedure report that was given to you for any specific questions about what was found during the examination.  If the procedure report does not answer your questions, please call your gastroenterologist to clarify.  If you requested that your care partner not be given the details of your procedure findings, then the procedure report has been included in a sealed envelope for you to review at your convenience later.  YOU SHOULD EXPECT: Some feelings of bloating in the abdomen. Passage of more gas than usual.  Walking can help get rid of the air that was put into your GI tract during the procedure and reduce the bloating. If you had a lower endoscopy (such as a colonoscopy or flexible sigmoidoscopy) you may notice spotting of blood in your stool or on the toilet paper. If you underwent a bowel prep for your procedure, then you may not have a normal bowel movement for a few days.  DIET:You may stay on clear liquids until 1pm today.  The, you may have a soft diet for the rest of the day.  Resume regular diet tomorrow..  Drink plenty of fluids but you should avoid alcoholic beverages for 24 hours.  ACTIVITY: Your care partner should take you home directly after the procedure.  You should plan to take it easy, moving slowly for the rest of the day.  You can resume normal activity the day after the procedure however you should NOT DRIVE or use heavy machinery for 24 hours (because of the sedation medicines used  during the test).    SYMPTOMS TO REPORT IMMEDIATELY: A gastroenterologist can be reached at any hour.  During normal business hours, 8:30 AM to 5:00 PM Monday through Friday, call [phone].  After hours and on weekends, please call the GI answering service at [phone] who will take a message and have the physician on call contact you.   Following lower endoscopy (colonoscopy or flexible sigmoidoscopy):  Excessive amounts of blood in the stool  Significant tenderness or worsening of abdominal pains  Swelling of the abdomen that is new, acute  Fever of 100F or higher  Following upper endoscopy (EGD)  Vomiting of blood or coffee ground material  New chest pain or pain under the shoulder blades  Painful or persistently difficult swallowing  New shortness of breath  Fever of 100F or higher  Black, tarry-looking stools  FOLLOW UP: If any biopsies were taken you will be contacted by phone or by letter within the next 1-3 weeks.  Call your gastroenterologist if you have not heard about the biopsies in 3 weeks.  Our staff will call the home number listed on your records the next business day following your procedure to check on you and address any questions or concerns that you may have at that time regarding the information given to you following your procedure. This is a courtesy call and so if there is no answer at the home number and  we have not heard from you through the emergency physician on call, we will assume that you have returned to your regular daily activities without incident.  SIGNATURES/CONFIDENTIALITY: You and/or your care partner have signed paperwork which will be entered into your electronic medical record.  These signatures attest to the fact that that the information above on your After Visit Summary has been reviewed and is understood.  Full responsibility of the confidentiality of this discharge information lies with you and/or your care-partner.

## 2013-10-19 NOTE — Progress Notes (Signed)
Called to room to assist during endoscopic procedure.  Patient ID and intended procedure confirmed with present staff. Received instructions for my participation in the procedure from the performing physician.  

## 2013-10-20 ENCOUNTER — Telehealth: Payer: Self-pay | Admitting: *Deleted

## 2013-10-20 NOTE — Telephone Encounter (Signed)
  Follow up Call-  Call back number 10/19/2013  Post procedure Call Back phone  # [phone]  Permission to leave phone message Yes     Patient questions:  Do you have a fever, pain , or abdominal swelling? no Pain Score  0 *  Have you tolerated food without any problems? yes  Have you been able to return to your normal activities? yes  Do you have any questions about your discharge instructions: Diet   no Medications  no Follow up visit  no  Do you have questions or concerns about your Care? no  Actions: * If pain score is 4 or above: No action needed, pain <4.

## 2013-10-23 ENCOUNTER — Other Ambulatory Visit: Payer: Self-pay | Admitting: Physician Assistant

## 2013-10-25 ENCOUNTER — Encounter: Payer: Self-pay | Admitting: Internal Medicine

## 2013-10-25 DIAGNOSIS — Z860100 Personal history of colon polyps, unspecified: Secondary | ICD-10-CM

## 2013-10-25 HISTORY — DX: Personal history of colonic polyps: Z86.010

## 2013-10-25 HISTORY — DX: Personal history of colon polyps, unspecified: Z86.0100

## 2013-10-25 NOTE — Progress Notes (Signed)
Quick Note:  Ok stop ranitidine and carafate and use lansoprazole qd See me July or so ______

## 2013-10-25 NOTE — Progress Notes (Signed)
Quick Note:  Esophageal bxs ok - no abnormalities - did dilation help?  Polyp benign but pre-cancerous - repeat colonoscopy 2020 (5 years)  [place] - no letter but needs 09/2018 colon recall ______

## 2013-10-27 ENCOUNTER — Encounter: Payer: Self-pay | Admitting: Physician Assistant

## 2013-10-27 ENCOUNTER — Ambulatory Visit (INDEPENDENT_AMBULATORY_CARE_PROVIDER_SITE_OTHER): Payer: Commercial Managed Care - PPO | Admitting: Physician Assistant

## 2013-10-27 VITALS — BP 113/78 | HR 66 | Temp 98.5°F | Resp 16 | Ht 63.0 in | Wt 162.0 lb

## 2013-10-27 DIAGNOSIS — K219 Gastro-esophageal reflux disease without esophagitis: Secondary | ICD-10-CM

## 2013-10-27 DIAGNOSIS — M79641 Pain in right hand: Secondary | ICD-10-CM

## 2013-10-27 DIAGNOSIS — R7989 Other specified abnormal findings of blood chemistry: Secondary | ICD-10-CM

## 2013-10-27 DIAGNOSIS — M79609 Pain in unspecified limb: Secondary | ICD-10-CM

## 2013-10-27 DIAGNOSIS — R945 Abnormal results of liver function studies: Principal | ICD-10-CM

## 2013-10-27 MED ORDER — LANSOPRAZOLE 30 MG PO CPDR: 30.0000 mg | DELAYED_RELEASE_CAPSULE | Freq: Every day | ORAL | Status: DC

## 2013-10-27 MED ORDER — CELECOXIB 100 MG PO CAPS: 100.0000 mg | ORAL_CAPSULE | Freq: Every day | ORAL | Status: DC

## 2013-10-27 NOTE — Patient Instructions (Signed)
I will contact you with your lab results as soon as they are available.   If you have not heard from me in 2 weeks, please contact me.  The fastest way to get your results is to register for My Chart (see the instructions on the last page of this printout).  Continue to decrease drinking alcohol.

## 2013-10-27 NOTE — Progress Notes (Signed)
   Subjective:    Patient ID: Cynthia Spence, female    DOB: [birth year], 53 y.o.   MRN: [id number]  HPI Pt presents to clinic for HTN recheck and recheck of her LFTS which were slightly high at her last visit.  She has been a heavy ETOH drinker in the past but recently has decreased to a 6 pack per week.  She has never been tested for Hepatitis that she knows of.  She is taking her medication without problems.  Her heartburn resolved with the Prevacid but she has been out and she has started to have some symptoms again.  She did see a Dr Carlean Purl who did stretch her esophagus so she no longer feels like food is getting stuck in her throat.  She is also wondering if should could have a Rx for Celebrex.  She was given it for a strain and noticed that her right finger did not hurt while she was on the medication.  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  Review of Systems     Objective:   Physical Exam  Vitals reviewed. Constitutional: She is oriented to person, place, and time. She appears well-developed and well-nourished.  HENT:  Head: Normocephalic and atraumatic.  Right Ear: External ear normal.  Left Ear: External ear normal.  Cardiovascular: Normal rate, regular rhythm and normal heart sounds.   No murmur heard. Pulmonary/Chest: Effort normal.  Musculoskeletal:       Hands: Neurological: She is alert and oriented to person, place, and time.  Skin: Skin is warm and dry.  Psychiatric: She has a normal mood and affect. Her behavior is normal. Judgment and thought content normal.       Assessment & Plan:  Elevated LFTs - Recheck LFTs - she is on a statin but her levels have not increased recently and she has been on the statin for a long time.  If she slightly elevated we will check her for Hep B and C due to her upbringing in [place] and  her drinking history.  She will continue to decrease her ETOh intake Plan: COMPLETE METABOLIC PANEL WITH GFR  Esophageal reflux - Plan: lansoprazole (PREVACID) 30 MG capsule  Hand pain, right - We will start Celebrex due to elevated LFTS and GERD and her positive response to the medication in the past.  If her pain continues to worse we will do xrays at that time.  Plan: celecoxib (CELEBREX) 100 MG capsule  Windell Hummingbird PA-C  [facility] 10/28/2013 2:18 PM

## 2013-10-28 ENCOUNTER — Encounter: Payer: Self-pay | Admitting: Physician Assistant

## 2013-10-28 LAB — COMPLETE METABOLIC PANEL WITH GFR
ALBUMIN: 4.3 g/dL (ref 3.5–5.2)
ALT: 37 U/L — AB (ref 0–35)
AST: 27 U/L (ref 0–37)
Alkaline Phosphatase: 75 U/L (ref 39–117)
BUN: 18 mg/dL (ref 6–23)
CALCIUM: 9.5 mg/dL (ref 8.4–10.5)
CO2: 29 meq/L (ref 19–32)
Chloride: 100 mEq/L (ref 96–112)
Creat: 0.74 mg/dL (ref 0.50–1.10)
GFR, Est Non African American: >89 mL/min
Glucose, Bld: 89 mg/dL (ref 70–99)
POTASSIUM: 3.3 meq/L — AB (ref 3.5–5.3)
SODIUM: 138 meq/L (ref 135–145)
TOTAL PROTEIN: 7.4 g/dL (ref 6.0–8.3)
Total Bilirubin: 0.5 mg/dL (ref 0.2–1.2)

## 2013-12-08 ENCOUNTER — Ambulatory Visit: Payer: Self-pay | Admitting: Physician Assistant

## 2013-12-27 ENCOUNTER — Ambulatory Visit: Payer: Commercial Managed Care - PPO | Admitting: Internal Medicine

## 2014-01-20 ENCOUNTER — Other Ambulatory Visit: Payer: Self-pay | Admitting: Physician Assistant

## 2014-01-27 ENCOUNTER — Ambulatory Visit: Payer: Self-pay | Admitting: Physician Assistant

## 2014-03-06 ENCOUNTER — Other Ambulatory Visit: Payer: Self-pay | Admitting: Physician Assistant

## 2014-03-10 ENCOUNTER — Ambulatory Visit: Payer: Commercial Managed Care - PPO | Admitting: Physician Assistant

## 2014-04-30 ENCOUNTER — Other Ambulatory Visit: Payer: Self-pay | Admitting: Physician Assistant

## 2014-05-06 ENCOUNTER — Other Ambulatory Visit: Payer: Self-pay | Admitting: Physician Assistant

## 2014-06-07 ENCOUNTER — Other Ambulatory Visit: Payer: Self-pay | Admitting: Physician Assistant

## 2014-06-18 ENCOUNTER — Other Ambulatory Visit: Payer: Self-pay | Admitting: Physician Assistant

## 2014-07-21 ENCOUNTER — Ambulatory Visit: Payer: Commercial Managed Care - PPO

## 2014-07-21 ENCOUNTER — Ambulatory Visit (INDEPENDENT_AMBULATORY_CARE_PROVIDER_SITE_OTHER): Payer: Commercial Managed Care - PPO

## 2014-07-21 ENCOUNTER — Ambulatory Visit (INDEPENDENT_AMBULATORY_CARE_PROVIDER_SITE_OTHER): Payer: Commercial Managed Care - PPO | Admitting: Family Medicine

## 2014-07-21 VITALS — BP 130/90 | HR 63 | Temp 97.9°F | Resp 16 | Ht 64.0 in | Wt 173.0 lb

## 2014-07-21 DIAGNOSIS — M25562 Pain in left knee: Secondary | ICD-10-CM

## 2014-07-21 DIAGNOSIS — K219 Gastro-esophageal reflux disease without esophagitis: Secondary | ICD-10-CM

## 2014-07-21 DIAGNOSIS — E785 Hyperlipidemia, unspecified: Secondary | ICD-10-CM

## 2014-07-21 DIAGNOSIS — M7989 Other specified soft tissue disorders: Secondary | ICD-10-CM

## 2014-07-21 DIAGNOSIS — M79642 Pain in left hand: Secondary | ICD-10-CM

## 2014-07-21 DIAGNOSIS — M79641 Pain in right hand: Secondary | ICD-10-CM

## 2014-07-21 DIAGNOSIS — I10 Essential (primary) hypertension: Secondary | ICD-10-CM

## 2014-07-21 DIAGNOSIS — M25462 Effusion, left knee: Secondary | ICD-10-CM

## 2014-07-21 LAB — POCT CBC
Granulocyte percent: 59.2 %G (ref 37–80)
HCT, POC: 46.4 % (ref 37.7–47.9)
Hemoglobin: 15 g/dL (ref 12.2–16.2)
Lymph, poc: 2.4 (ref 0.6–3.4)
MCH, POC: 30.7 pg (ref 27–31.2)
MCHC: 32.5 g/dL (ref 31.8–35.4)
MCV: 94.6 fL (ref 80–97)
MID (cbc): 0.4 (ref 0–0.9)
MPV: 6.3 fL (ref 0–99.8)
POC Granulocyte: 4.1 (ref 2–6.9)
POC LYMPH %: 34.9 % (ref 10–50)
POC MID %: 5.9 %M (ref 0–12)
Platelet Count, POC: 275 10*3/uL (ref 142–424)
RBC: 4.9 M/uL (ref 4.04–5.48)
RDW, POC: 13.9 %
WBC: 6.9 10*3/uL (ref 4.6–10.2)

## 2014-07-21 MED ORDER — ATORVASTATIN CALCIUM 20 MG PO TABS: 20.0000 mg | ORAL_TABLET | Freq: Every day | ORAL | Status: DC

## 2014-07-21 MED ORDER — ATENOLOL 50 MG PO TABS: 50.0000 mg | ORAL_TABLET | Freq: Every day | ORAL | Status: DC

## 2014-07-21 MED ORDER — LANSOPRAZOLE 30 MG PO CPDR: 30.0000 mg | DELAYED_RELEASE_CAPSULE | Freq: Every day | ORAL | Status: DC

## 2014-07-21 MED ORDER — MELOXICAM 7.5 MG PO TABS: 7.5000 mg | ORAL_TABLET | Freq: Every day | ORAL | Status: DC

## 2014-07-21 NOTE — Patient Instructions (Addendum)
-   Please take Meloxicam 7.5mg  once daily. You may use up to 15mg  in one day but not more than this. If you need a refill for this, please call and let me know. - You can also use Menthol cream or Capsaicin cream for your joint pain.   - Please measure your blood pressure once a week and bring these readings back to me in 1 month for a recheck. - You can call our office and request an appointment in 1 month at the appointment center or request my schedule at the walk-in-clinic.

## 2014-07-21 NOTE — Progress Notes (Signed)
MRN: [id number] DOB: Aug 01, 1960  Subjective:   Cynthia Spence is a 54 y.o. female presenting for chief complaint of joint pain and medication refill.  Reports 2 month history of left knee pain and swelling, worse in the morning and lasts throughout the majority of the day, has knee buckling and weakness at times. Also reports bilateral hand and joint pain with swelling. Admits fatigue, intermittent headaches. Has been using Alleve, heat pads, otc herbal remedies without relief. Works Warehouse manager, stands long periods of times, uses hand tools daily. Denies fevers, erythema, numbness and tingling, decreased sensation, weight loss, abdominal pain, n/v, chest pain, shob. Denies any other aggravating or relieving factors, no other questions or concerns.  HTN - managed with atenolol for her HTN. Diet is "okay", does not exercise, uses salt with her food. She denies dizziness, changes in her vision, urinary symptoms, lower leg swelling. Does not smoke, occasional alcohol drink.  Hyperlipidemia - managed with atorvastatin, denies myalgias except as above. Diet and exercise as above.  Cynthia Spence has a current medication list which includes the following prescription(s): atenolol, atorvastatin, and lansoprazole. She has No Known Allergies.  Cynthia Spence  has a past medical history of Hypertension; GERD (gastroesophageal reflux disease); HLD (hyperlipidemia); and Personal history of colonic polyp - adenoma (10/25/2013). Also  has past surgical history that includes Breast surgery (Left); Nose surgery; Eye surgery; and Tubal ligation.  ROS As in subjective.  Objective:   Vitals: BP 130/90 mmHg  Pulse 63  Temp(Src) 97.9 F (36.6 C) (Oral)  Resp 16  Ht 5' 4" (1.626 m)  Wt 173 lb (78.472 kg)  BMI 29.68 kg/m2  SpO2 98%  BP Readings from Last 3 Encounters:  07/21/14 130/90  10/27/13 113/78  10/19/13 119/73   Physical Exam  Constitutional: She is oriented to person, place, and time and  well-developed, well-nourished, and in no distress.  Cardiovascular: Normal rate.   Pulmonary/Chest: Effort normal.  Musculoskeletal:       Left knee: She exhibits swelling (medially and laterally). She exhibits normal range of motion, no ecchymosis, no deformity, no laceration, no erythema and no bony tenderness. Tenderness (laterally) found.       Right hand: She exhibits decreased range of motion (sligthly decreased flexion), bony tenderness (over DIP, PIP, MCP of 2-4th digits), deformity (swan neck 2nd and 3rd digits) and swelling (trace edema over 2nd and 3rd digits at DIP, PIP, MCP). She exhibits normal capillary refill and no laceration. Normal sensation noted. Normal strength noted.       Left hand: She exhibits decreased range of motion (slightly decreased flexion), bony tenderness, deformity (swan neck left 2nd, 3rd, 4th digits) and swelling (trace edema over 1-4 digits at DIP, PIP joints). She exhibits normal capillary refill. Tenderness: over DIP, PIP, MCP of 2-4th digits. Normal sensation noted. Normal strength noted.  Neurological: She is alert and oriented to person, place, and time.  Skin: Skin is warm and dry. No rash noted. No erythema. No pallor.   Results for orders placed or performed in visit on 07/21/14 (from the past 24 hour(s))  POCT CBC     Status: None   Collection Time: 07/21/14  7:02 PM  Result Value Ref Range   WBC 6.9 4.6 - 10.2 K/uL   Lymph, poc 2.4 0.6 - 3.4   POC LYMPH PERCENT 34.9 10 - 50 %L   MID (cbc) 0.4 0 - 0.9   POC MID % 5.9 0 - 12 %M   POC Granulocyte  4.1 2 - 6.9   Granulocyte percent 59.2 37 - 80 %G   RBC 4.90 4.04 - 5.48 M/uL   Hemoglobin 15.0 12.2 - 16.2 g/dL   HCT, POC 46.4 37.7 - 47.9 %   MCV 94.6 80 - 97 fL   MCH, POC 30.7 27 - 31.2 pg   MCHC 32.5 31.8 - 35.4 g/dL   RDW, POC 13.9 %   Platelet Count, POC 275 142 - 424 K/uL   MPV 6.3 0 - 99.8 fL   [facility] reading (PRIMARY) by  Dr. Copland and PA-Mani. Left knee x-ray: Evidence of medial  compartment degenerative changes. Left and right hand x-ray: No evidence of fracture, dislocation or degenerative changes.  Assessment and Plan :   1. Left knee pain 2. Knee swelling, left - Likely due to degenerative changes, advised conservative management, modification of activities, weight loss, NSAID, topical menthol and/or capsaicin - Follow up in 1 month  3. Bilateral hand swelling 4. Bilateral hand pain - Consider inflammatory process, labs pending - Conservative management  UPDATE: Radiologist reported possible foreign body and early osteomyelitis in left hand. Given time course of development of pain and bilateral hand symptoms, cc of knee pain, will consult Orthopedist 07/22/2014  5. Essential hypertension - Elevated today, labs pending, will recheck in 1 month, consider HCT for management of HTN  6. Hyperlipidemia - labs pending, refilled atorvastatin  7. Gastroesophageal reflux disease, esophagitis presence not specified - stable, refilled medication per patient's request   Mario Mani, PA-C [facility] [phone] 07/21/2014 7:09 PM 

## 2014-07-22 ENCOUNTER — Telehealth: Payer: Self-pay | Admitting: Urgent Care

## 2014-07-22 LAB — BASIC METABOLIC PANEL
BUN: 14 mg/dL (ref 6–23)
CALCIUM: 10.1 mg/dL (ref 8.4–10.5)
CHLORIDE: 100 meq/L (ref 96–112)
CO2: 26 mEq/L (ref 19–32)
CREATININE: 0.59 mg/dL (ref 0.50–1.10)
GLUCOSE: 101 mg/dL — AB (ref 70–99)
POTASSIUM: 3.9 meq/L (ref 3.5–5.3)
Sodium: 137 mEq/L (ref 135–145)

## 2014-07-22 LAB — LIPID PANEL
CHOL/HDL RATIO: 3.9 ratio
CHOLESTEROL: 283 mg/dL — AB (ref 0–200)
HDL: 72 mg/dL (ref >=46)
LDL Cholesterol: 175 mg/dL — ABNORMAL HIGH (ref 0–99)
Triglycerides: 179 mg/dL — ABNORMAL HIGH (ref <150)
VLDL: 36 mg/dL (ref 0–40)

## 2014-07-22 LAB — C-REACTIVE PROTEIN: CRP: 0.5 mg/dL (ref <0.60)

## 2014-07-22 LAB — SEDIMENTATION RATE: Sed Rate: 12 mm/hr (ref 0–30)

## 2014-07-22 LAB — RHEUMATOID FACTOR

## 2014-07-22 NOTE — Telephone Encounter (Signed)
I was able to speak with Dr. Burney Gauze who was on call for hand surgery.  The tiny piece of metal in her finger should be left in place unless definite sx attributable to the fragment arise,  At this time we will leave it alone. There is no clinical osteomyelitis so do not need to treat this either.  Called pt and let her know.

## 2014-07-22 NOTE — Telephone Encounter (Signed)
Spoke with patient regarding results and radiologist over read. Patient confirms that her knee and bilateral hand started 2 months ago without any inciting factors including trauma. Regarding needle fragment, patients confirms that she sustained a needle injury 5 years ago while working at a [employer]. She was treated and per the patient thought the entire needle was removed. She remained asymptomatic except for the past 2 months. She started meloxicam yesterday and took a second dose today, reports significant improvement in her hand swelling bilaterally and pain. I advised her that I would consult ortho to figure out what to do about the needle fragment.   Jaynee Eagles, PA-C [facility] [phone] 07/22/2014  4:26 PM

## 2014-07-25 ENCOUNTER — Encounter: Payer: Self-pay | Admitting: Urgent Care

## 2014-07-25 LAB — CYCLIC CITRUL PEPTIDE ANTIBODY, IGG

## 2014-08-26 ENCOUNTER — Other Ambulatory Visit: Payer: Self-pay | Admitting: Urgent Care

## 2014-08-26 NOTE — Telephone Encounter (Signed)
Ronalee Belts, do you want to give RFs?

## 2014-08-27 NOTE — Telephone Encounter (Signed)
Patient should come back for an office visit since meloxicam is not a long term medication for pain relief. There are alternative treatments that should be discussed but her current symptoms also need to be considered. In the mean time, patient I will refill 30 pills which should be plenty for patient to return to clinic. Patient also agreed to follow up at the end of March regarding her blood pressure.   Please provide patient with my schedule so that she can come in when I am available. Let her know we will do 30 more days of meloxicam to help before she comes in.  Jaynee Eagles, PA-C [facility] [phone] 08/27/2014  1:21 PM

## 2014-09-10 ENCOUNTER — Other Ambulatory Visit: Payer: Self-pay | Admitting: Urgent Care

## 2014-11-06 ENCOUNTER — Other Ambulatory Visit: Payer: Self-pay | Admitting: Urgent Care

## 2015-01-31 ENCOUNTER — Other Ambulatory Visit: Payer: Self-pay | Admitting: Gynecology

## 2015-02-02 LAB — CYTOLOGY - PAP

## 2015-03-06 ENCOUNTER — Ambulatory Visit (INDEPENDENT_AMBULATORY_CARE_PROVIDER_SITE_OTHER): Payer: Commercial Managed Care - PPO | Admitting: Physician Assistant

## 2015-03-06 VITALS — BP 110/80 | HR 96 | Temp 97.9°F | Resp 18 | Ht 63.0 in | Wt 164.1 lb

## 2015-03-06 DIAGNOSIS — R21 Rash and other nonspecific skin eruption: Secondary | ICD-10-CM

## 2015-03-06 DIAGNOSIS — L299 Pruritus, unspecified: Secondary | ICD-10-CM | POA: Diagnosis not present

## 2015-03-06 LAB — POCT SKIN KOH: SKIN KOH, POC: NEGATIVE

## 2015-03-06 MED ORDER — CLOBETASOL PROP EMOLLIENT BASE 0.05 % EX CREA: TOPICAL_CREAM | CUTANEOUS | Status: DC

## 2015-03-06 MED ORDER — PERMETHRIN 1 % EX LOTN: 1.0000 "application " | TOPICAL_LOTION | Freq: Once | CUTANEOUS | Status: DC

## 2015-03-06 NOTE — Progress Notes (Signed)
03/06/2015 at 9:13 PM  Cynthia Spence / DOB: [birth year] / MRN: [id number]  The patient has Hypertension; Hypercholesteremia; and Personal history of colonic polyp - adenoma on her problem list.  SUBJECTIVE  Cynthia Spence is a 54 y.o. well appearing female presenting for the chief complaint of itchy bumps on her head that started 1 month ago.  She has tried dandruff shampoo without relief.  Denies any new sleeping arrangements.  She has never had this problem before.       She  has a past medical history of Hypertension; GERD (gastroesophageal reflux disease); HLD (hyperlipidemia); and Personal history of colonic polyp - adenoma (10/25/2013).    Medications reviewed and updated by myself where necessary, and exist elsewhere in the encounter.   Cynthia Spence has No Known Allergies. She  reports that she has never smoked. She has never used smokeless tobacco. She reports that she drinks alcohol. She reports that she does not use illicit drugs. She  has no sexual activity history on file. The patient  has past surgical history that includes Breast surgery (Left); Nose surgery; Eye surgery; and Tubal ligation.  Her family history includes Stomach cancer in her mother.  Review of Systems  Constitutional: Negative for fever, chills and weight loss.  Eyes: Negative for blurred vision.  Skin: Positive for itching and rash.  Neurological: Negative for dizziness and headaches.  Endo/Heme/Allergies: Negative for polydipsia.    OBJECTIVE  Her  height is 5\' 3"  (1.6 m) and weight is 164 lb 2 oz (74.447 kg). Her oral temperature is 97.9 F (36.6 C). Her blood pressure is 110/80 and her pulse is 96. Her respiration is 18 and oxygen saturation is 99%.  The patient's body mass index is 29.08 kg/(m^2).  Physical Exam  Constitutional: She is oriented to person, place, and time. She appears well-developed and well-nourished. No distress.  HENT:  Head:    Eyes: Pupils are equal, round, and  reactive to light.  Cardiovascular: Normal rate.   Respiratory: Effort normal. No respiratory distress.  GI: She exhibits no distension.  Neurological: She is alert and oriented to person, place, and time.  Skin: Skin is warm and dry. She is not diaphoretic.  Psychiatric: She has a normal mood and affect. Her behavior is normal. Judgment and thought content normal.    Results for orders placed or performed in visit on 03/06/15 (from the past 24 hour(s))  POCT Skin KOH     Status: None   Collection Time: 03/06/15  6:52 PM  Result Value Ref Range   Skin KOH, POC Negative     ASSESSMENT & PLAN  Cynthia Spence was seen today for head sores and flu vaccine.  Diagnoses and all orders for this visit:  Itchy scalp: KOH negative.  Will rule out Nits with permethrin shampoo.  She is to start clobetasol cream in 24-48 hours if no improvement with nit treatment.  Advised that if her rash improves with Clobetasol, her rash is likely immunogenic in nature and a dermatology referral may be appropriate.  RTC in one week if not better to consider punch and/or derm referral.   -     POCT Skin KOH -     permethrin (PERMETHRIN LICE TREATMENT) 1 % lotion; Apply 1 application topically once. Shampoo, rinse and towel dry hair, saturate hair and scalp with permethrin. Rinse after 10 min; repeat in 1 week if needed -     Clobetasol Prop Emollient Base 0.05 % emollient cream; Use  twice daily for itchy spots on your scalp.  Localized maculopapular rash -     POCT Skin KOH -     permethrin (PERMETHRIN LICE TREATMENT) 1 % lotion; Apply 1 application topically once. Shampoo, rinse and towel dry hair, saturate hair and scalp with permethrin. Rinse after 10 min; repeat in 1 week if needed    The patient was advised to call or come back to clinic if she does not see an improvement in symptoms, or worsens with the above plan.   Cynthia Spence, MHS, PA-C [facility] 03/06/2015  9:13 PM

## 2015-12-06 ENCOUNTER — Ambulatory Visit (INDEPENDENT_AMBULATORY_CARE_PROVIDER_SITE_OTHER): Payer: Commercial Managed Care - PPO

## 2015-12-06 ENCOUNTER — Encounter: Payer: Self-pay | Admitting: Urgent Care

## 2015-12-06 ENCOUNTER — Ambulatory Visit (INDEPENDENT_AMBULATORY_CARE_PROVIDER_SITE_OTHER): Payer: Commercial Managed Care - PPO | Admitting: Urgent Care

## 2015-12-06 VITALS — BP 120/82 | HR 95 | Temp 98.2°F | Resp 16 | Ht 62.75 in | Wt 164.4 lb

## 2015-12-06 DIAGNOSIS — R0789 Other chest pain: Secondary | ICD-10-CM | POA: Diagnosis not present

## 2015-12-06 DIAGNOSIS — Z8679 Personal history of other diseases of the circulatory system: Secondary | ICD-10-CM

## 2015-12-06 MED ORDER — OMEPRAZOLE 20 MG PO CPDR: 20.0000 mg | DELAYED_RELEASE_CAPSULE | Freq: Every day | ORAL | Status: AC

## 2015-12-06 MED ORDER — FAMOTIDINE 20 MG PO TABS: 20.0000 mg | ORAL_TABLET | Freq: Two times a day (BID) | ORAL | Status: AC

## 2015-12-06 NOTE — Progress Notes (Signed)
    MRN: [id number] DOB: [birth year]  Subjective:   Cynthia Spence is a 55 y.o. female presenting for chief complaint of chest hurts when cough  Reports 4 day history of left-sided chest pain, occasional dry cough. Chest pain is located over mid-sternum to left upper chest, achy and sharp, is elicited with movement and using left arm, occasionally radiates to left shoulder associated with occasional shob. Has tried Gas X with minimal relief. Admits that she eats fried foods, spicy foods, tomato based foods. Denies fever, chest tightness, trauma, n/v, abdominal pain, sore throat. Denies smoking cigarettes. Denies family history of MI but admits history of HTN with her cousin.  Cynthia Spence has a current medication list which includes the following prescription(s): clobetasol prop emollient base and permethrin. Also has No Known Allergies.  Cynthia Spence  has a past medical history of Hypertension; GERD (gastroesophageal reflux disease); HLD (hyperlipidemia); and Personal history of colonic polyp - adenoma (10/25/2013). Also  has past surgical history that includes Breast surgery (Left); Nose surgery; Eye surgery; and Tubal ligation.  Objective:   Vitals: BP 120/82 mmHg  Pulse 95  Temp(Src) 98.2 F (36.8 C) (Oral)  Resp 16  Ht 5' 2.75" (1.594 m)  Wt 164 lb 6.4 oz (74.571 kg)  BMI 29.35 kg/m2  SpO2 99%  Physical Exam  Constitutional: She is oriented to person, place, and time. She appears well-developed and well-nourished.  HENT:  Mouth/Throat: Oropharynx is clear and moist.  Eyes: Pupils are equal, round, and reactive to light. No scleral icterus.  Cardiovascular: Normal rate, regular rhythm and intact distal pulses.  Exam reveals no gallop and no friction rub.   No murmur heard. Pulmonary/Chest: No respiratory distress. She has no wheezes. She has no rales. She exhibits tenderness (over area depicted).    Abdominal: Soft. Bowel sounds are normal. She exhibits no distension and no mass. There  is no tenderness.  Musculoskeletal: She exhibits no edema.  Neurological: She is alert and oriented to person, place, and time.  Skin: Skin is warm and dry.   Dg Chest 2 View  12/06/2015  CLINICAL DATA:  55 year old female with chest pain for 4 days. EXAM: CHEST  2 VIEW COMPARISON:  11/30/2006 chest radiograph FINDINGS: The cardiomediastinal silhouette is unremarkable. There is no evidence of focal airspace disease, pulmonary edema, suspicious pulmonary nodule/mass, pleural effusion, or pneumothorax. No acute bony abnormalities are identified. IMPRESSION: No active cardiopulmonary disease. Electronically Signed   By: Margarette Canada M.D.   On: 12/06/2015 17:09    Ecg interpretation - normal sinus rhythm.  Assessment and Plan :   1. Atypical chest pain 2. History of essential hypertension - Likely GERD but potentially musculoskeletal. Will have patient start Pepcid and/or omeprazole, advised dietary modifications. Patient may use naproxen otc to address musculoskeletal component. RTC in 4 weeks if no improvement.  Jaynee Eagles, PA-C [facility] [phone] 12/06/2015 4:21 PM

## 2015-12-06 NOTE — Patient Instructions (Addendum)
Food Choices for Gastroesophageal Reflux Disease, Adult When you have gastroesophageal reflux disease (GERD), the foods you eat and your eating habits are very important. Choosing the right foods can help ease the discomfort of GERD. WHAT GENERAL GUIDELINES DO I NEED TO FOLLOW?  Choose fruits, vegetables, whole grains, low-fat dairy products, and low-fat meat, fish, and poultry.  Limit fats such as oils, salad dressings, butter, nuts, and avocado.  Keep a food diary to identify foods that cause symptoms.  Avoid foods that cause reflux. These may be different for different people.  Eat frequent small meals instead of three large meals each day.  Eat your meals slowly, in a relaxed setting.  Limit fried foods.  Cook foods using methods other than frying.  Avoid drinking alcohol.  Avoid drinking large amounts of liquids with your meals.  Avoid bending over or lying down until 2-3 hours after eating. WHAT FOODS ARE NOT RECOMMENDED? The following are some foods and drinks that may worsen your symptoms: Vegetables Tomatoes. Tomato juice. Tomato and spaghetti sauce. Chili peppers. Onion and garlic. Horseradish. Fruits Oranges, grapefruit, and lemon (fruit and juice). Meats High-fat meats, fish, and poultry. This includes hot dogs, ribs, ham, sausage, salami, and bacon. Dairy Whole milk and chocolate milk. Sour cream. Cream. Butter. Ice cream. Cream cheese.  Beverages Coffee and tea, with or without caffeine. Carbonated beverages or energy drinks. Condiments Hot sauce. Barbecue sauce.  Sweets/Desserts Chocolate and cocoa. Donuts. Peppermint and spearmint. Fats and Oils High-fat foods, including [place] fries and potato chips. Other Vinegar. Strong spices, such as black pepper, white pepper, red pepper, cayenne, curry powder, cloves, ginger, and chili powder. The items listed above may not be a complete list of foods and beverages to avoid. Contact your dietitian for more  information.   This information is not intended to replace advice given to you by your health care provider. Make sure you discuss any questions you have with your health care provider.   Document Released: 05/13/2005 Document Revised: 06/03/2014 Document Reviewed: 03/17/2013 Elsevier Interactive Patient Education 2016 [employer].     IF you received an x-ray today, you will receive an invoice from [facility]. Please contact [facility] at [phone] with questions or concerns regarding your invoice.   IF you received labwork today, you will receive an invoice from [employer]. Please contact [facility] at [phone] with questions or concerns regarding your invoice.   Our billing staff will not be able to assist you with questions regarding bills from these companies.  You will be contacted with the lab results as soon as they are available. The fastest way to get your results is to activate your My Chart account. Instructions are located on the last page of this paperwork. If you have not heard from [place] regarding the results in 2 weeks, please contact this office.

## 2015-12-07 ENCOUNTER — Encounter: Payer: Self-pay | Admitting: Urgent Care

## 2015-12-07 LAB — COMPREHENSIVE METABOLIC PANEL
ALT: 75 U/L — ABNORMAL HIGH (ref 6–29)
AST: 55 U/L — ABNORMAL HIGH (ref 10–35)
Albumin: 4 g/dL (ref 3.6–5.1)
Alkaline Phosphatase: 88 U/L (ref 33–130)
BUN: 18 mg/dL (ref 7–25)
CHLORIDE: 105 mmol/L (ref 98–110)
CO2: 26 mmol/L (ref 20–31)
CREATININE: 0.68 mg/dL (ref 0.50–1.05)
Calcium: 9.2 mg/dL (ref 8.6–10.4)
GLUCOSE: 89 mg/dL (ref 65–99)
POTASSIUM: 4.2 mmol/L (ref 3.5–5.3)
SODIUM: 140 mmol/L (ref 135–146)
TOTAL PROTEIN: 7.1 g/dL (ref 6.1–8.1)
Total Bilirubin: 0.4 mg/dL (ref 0.2–1.2)

## 2015-12-07 LAB — CBC
HCT: 42.7 % (ref 35.0–45.0)
Hemoglobin: 14.4 g/dL (ref 11.7–15.5)
MCH: 31.1 pg (ref 27.0–33.0)
MCHC: 33.7 g/dL (ref 32.0–36.0)
MCV: 92.2 fL (ref 80.0–100.0)
MPV: 9.3 fL (ref 7.5–12.5)
PLATELETS: 242 10*3/uL (ref 140–400)
RBC: 4.63 MIL/uL (ref 3.80–5.10)
RDW: 14 % (ref 11.0–15.0)
WBC: 5.8 10*3/uL (ref 3.8–10.8)

## 2016-11-14 DIAGNOSIS — I10 Essential (primary) hypertension: Secondary | ICD-10-CM | POA: Diagnosis not present

## 2016-11-14 DIAGNOSIS — E7800 Pure hypercholesterolemia, unspecified: Secondary | ICD-10-CM | POA: Diagnosis not present

## 2016-11-14 DIAGNOSIS — K219 Gastro-esophageal reflux disease without esophagitis: Secondary | ICD-10-CM | POA: Diagnosis not present

## 2016-11-20 ENCOUNTER — Other Ambulatory Visit: Payer: Self-pay | Admitting: Family Medicine

## 2016-11-20 DIAGNOSIS — R945 Abnormal results of liver function studies: Principal | ICD-10-CM

## 2016-11-20 DIAGNOSIS — R7989 Other specified abnormal findings of blood chemistry: Secondary | ICD-10-CM

## 2016-11-21 DIAGNOSIS — R945 Abnormal results of liver function studies: Secondary | ICD-10-CM | POA: Diagnosis not present

## 2016-12-02 ENCOUNTER — Ambulatory Visit
Admission: RE | Admit: 2016-12-02 | Discharge: 2016-12-02 | Disposition: A | Discharge status: Home / Self Care | Payer: BLUE CROSS/BLUE SHIELD | Source: Ambulatory Visit | Attending: Family Medicine | Admitting: Family Medicine

## 2016-12-02 DIAGNOSIS — R7989 Other specified abnormal findings of blood chemistry: Secondary | ICD-10-CM | POA: Diagnosis not present

## 2016-12-02 DIAGNOSIS — R945 Abnormal results of liver function studies: Principal | ICD-10-CM

## 2016-12-09 DIAGNOSIS — K760 Fatty (change of) liver, not elsewhere classified: Secondary | ICD-10-CM | POA: Diagnosis not present

## 2016-12-09 DIAGNOSIS — R748 Abnormal levels of other serum enzymes: Secondary | ICD-10-CM | POA: Diagnosis not present

## 2016-12-09 DIAGNOSIS — Z789 Other specified health status: Secondary | ICD-10-CM | POA: Diagnosis not present

## 2017-06-03 DIAGNOSIS — K760 Fatty (change of) liver, not elsewhere classified: Secondary | ICD-10-CM | POA: Diagnosis not present

## 2017-06-03 DIAGNOSIS — E7800 Pure hypercholesterolemia, unspecified: Secondary | ICD-10-CM | POA: Diagnosis not present

## 2017-06-03 DIAGNOSIS — K219 Gastro-esophageal reflux disease without esophagitis: Secondary | ICD-10-CM | POA: Diagnosis not present

## 2017-06-03 DIAGNOSIS — I10 Essential (primary) hypertension: Secondary | ICD-10-CM | POA: Diagnosis not present

## 2018-10-07 ENCOUNTER — Encounter: Payer: Self-pay | Admitting: Internal Medicine

## 2019-04-06 ENCOUNTER — Other Ambulatory Visit: Payer: Self-pay

## 2019-04-06 DIAGNOSIS — Z20822 Contact with and (suspected) exposure to covid-19: Secondary | ICD-10-CM

## 2019-04-07 LAB — NOVEL CORONAVIRUS, NAA: SARS-CoV-2, NAA: NOT DETECTED

## 2020-08-31 ENCOUNTER — Other Ambulatory Visit: Payer: Self-pay | Admitting: Obstetrics and Gynecology

## 2020-08-31 DIAGNOSIS — E041 Nontoxic single thyroid nodule: Secondary | ICD-10-CM

## 2020-09-15 ENCOUNTER — Ambulatory Visit
Admission: RE | Admit: 2020-09-15 | Discharge: 2020-09-15 | Disposition: A | Discharge status: Home / Self Care | Payer: BLUE CROSS/BLUE SHIELD | Source: Ambulatory Visit | Attending: Obstetrics and Gynecology | Admitting: Obstetrics and Gynecology

## 2020-09-15 DIAGNOSIS — E041 Nontoxic single thyroid nodule: Secondary | ICD-10-CM

## 2020-09-21 ENCOUNTER — Other Ambulatory Visit: Payer: Self-pay | Admitting: Obstetrics and Gynecology

## 2020-09-21 DIAGNOSIS — E041 Nontoxic single thyroid nodule: Secondary | ICD-10-CM

## 2020-10-04 ENCOUNTER — Other Ambulatory Visit (HOSPITAL_COMMUNITY)
Admission: RE | Admit: 2020-10-04 | Discharge: 2020-10-04 | Disposition: A | Discharge status: Home / Self Care | Payer: No Typology Code available for payment source | Source: Ambulatory Visit | Attending: Radiology | Admitting: Radiology

## 2020-10-04 ENCOUNTER — Ambulatory Visit
Admission: RE | Admit: 2020-10-04 | Discharge: 2020-10-04 | Disposition: A | Discharge status: Home / Self Care | Payer: No Typology Code available for payment source | Source: Ambulatory Visit | Attending: Obstetrics and Gynecology | Admitting: Obstetrics and Gynecology

## 2020-10-04 DIAGNOSIS — E041 Nontoxic single thyroid nodule: Secondary | ICD-10-CM | POA: Diagnosis not present

## 2020-10-05 LAB — CYTOLOGY - NON PAP

## 2020-10-20 ENCOUNTER — Encounter (HOSPITAL_COMMUNITY): Payer: Self-pay

## 2021-02-06 ENCOUNTER — Encounter: Payer: Self-pay | Admitting: Internal Medicine

## 2021-10-01 ENCOUNTER — Other Ambulatory Visit: Payer: Self-pay | Admitting: Internal Medicine

## 2021-10-01 DIAGNOSIS — E042 Nontoxic multinodular goiter: Secondary | ICD-10-CM

## 2022-03-12 ENCOUNTER — Other Ambulatory Visit: Payer: Self-pay | Admitting: Family Medicine

## 2022-03-12 ENCOUNTER — Ambulatory Visit
Admission: RE | Admit: 2022-03-12 | Discharge: 2022-03-12 | Disposition: A | Discharge status: Home / Self Care | Payer: No Typology Code available for payment source | Source: Ambulatory Visit | Attending: Family Medicine | Admitting: Family Medicine

## 2022-03-12 DIAGNOSIS — M25552 Pain in left hip: Secondary | ICD-10-CM

## 2022-03-14 ENCOUNTER — Other Ambulatory Visit: Payer: Self-pay | Admitting: Family Medicine

## 2022-03-14 DIAGNOSIS — M25452 Effusion, left hip: Secondary | ICD-10-CM

## 2022-04-30 ENCOUNTER — Encounter: Payer: Self-pay | Admitting: Family Medicine

## 2022-05-01 ENCOUNTER — Inpatient Hospital Stay: Admission: RE | Admit: 2022-05-01 | Payer: No Typology Code available for payment source | Source: Ambulatory Visit

## 2022-09-10 ENCOUNTER — Other Ambulatory Visit: Payer: Self-pay | Admitting: Family Medicine

## 2022-09-10 ENCOUNTER — Ambulatory Visit
Admission: RE | Admit: 2022-09-10 | Discharge: 2022-09-10 | Disposition: A | Discharge status: Home / Self Care | Payer: No Typology Code available for payment source | Source: Ambulatory Visit | Attending: Family Medicine | Admitting: Family Medicine

## 2022-09-10 DIAGNOSIS — M25562 Pain in left knee: Secondary | ICD-10-CM

## 2022-09-17 IMAGING — US US THYROID
1 series · 13 of 25 positions shown · non-contrast
Comparison: None.

CLINICAL DATA: Thyroid nodule

EXAM:
THYROID ULTRASOUND
TECHNIQUE: Ultrasound examination of the thyroid gland and adjacent soft
tissues was performed.

[Series 1: us thyroid · 0.08mm/px · 13 of 38 slices shown]
[im 1/38]
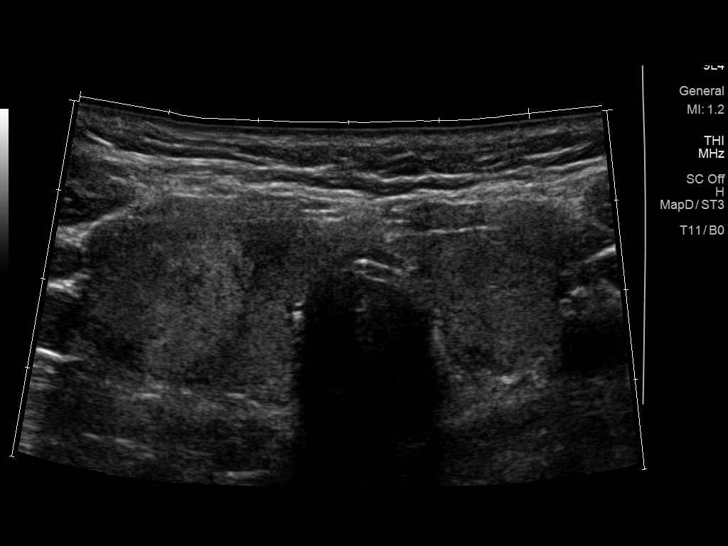
[im 4/38]
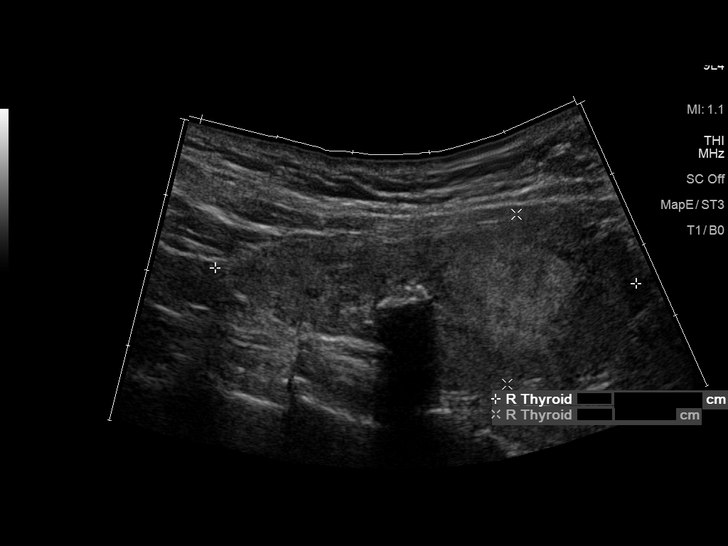
[im 7/38]
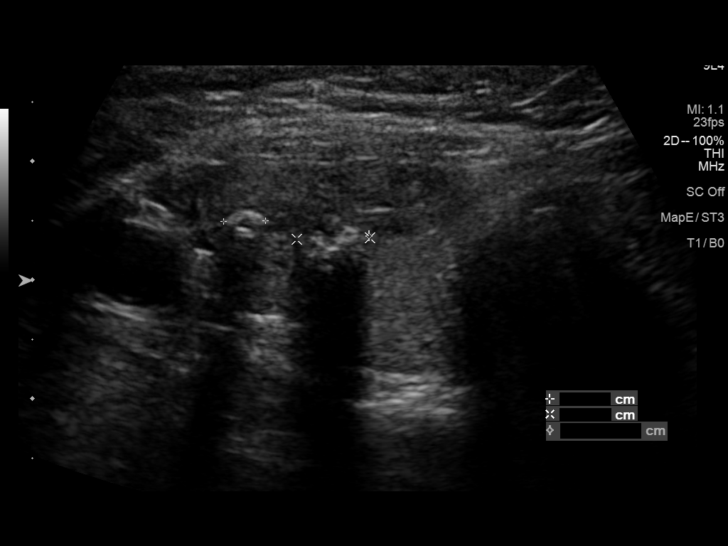
[im 10/38]
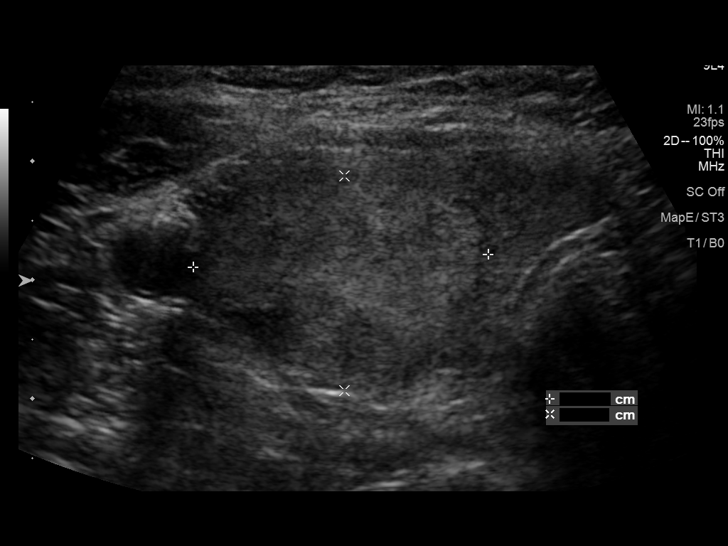
[im 13/38]
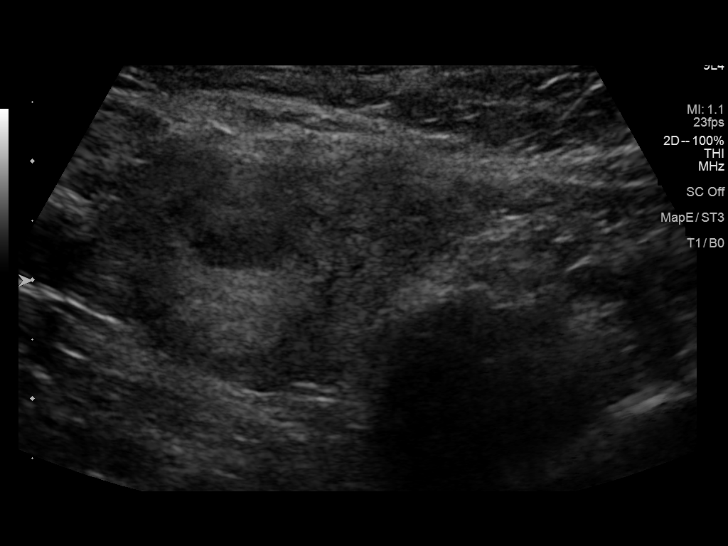
[im 16/38]
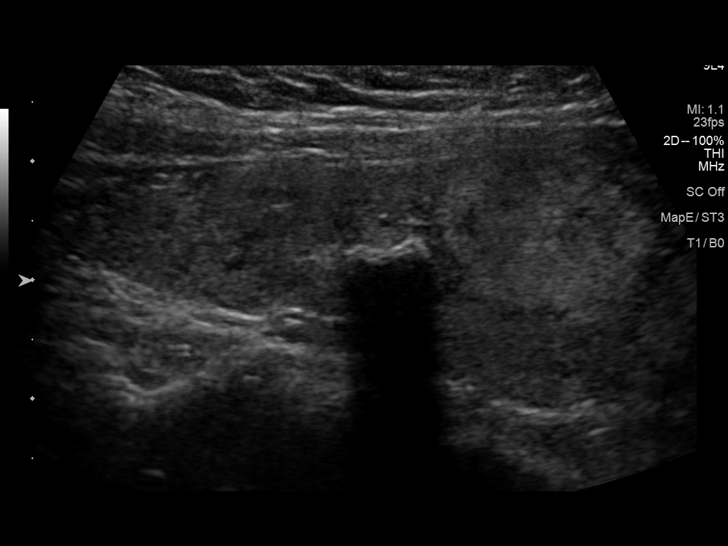
[im 19/38]
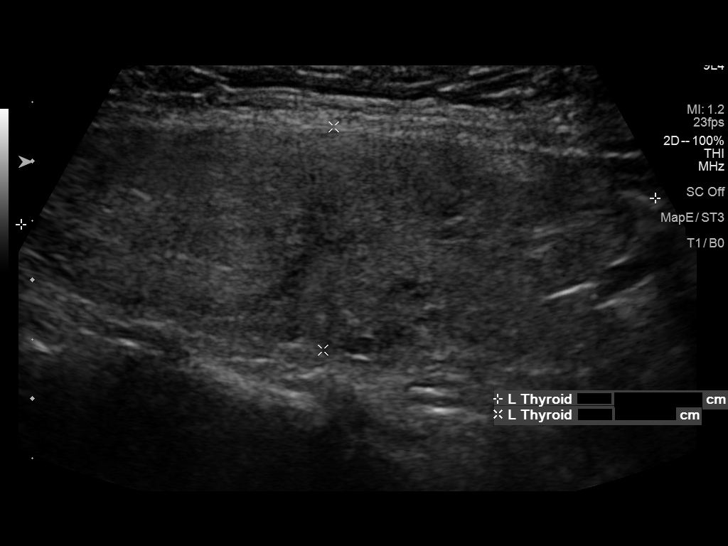
[im 22/38]
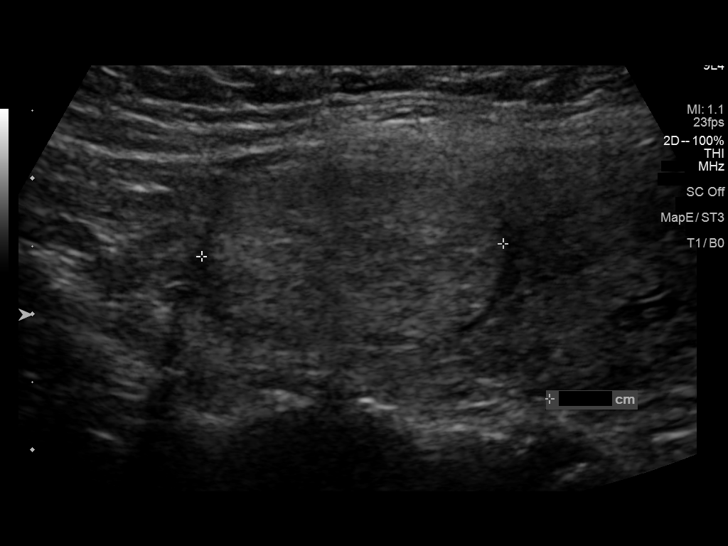
[im 25/38]
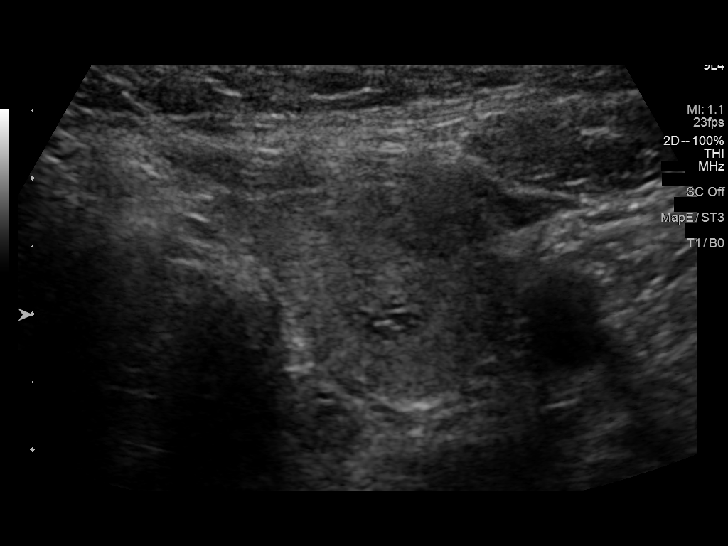
[im 28/38]
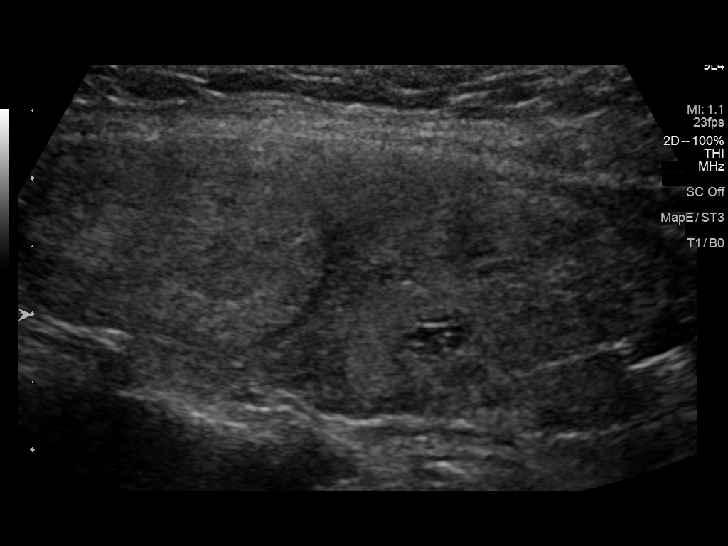
[im 31/38]
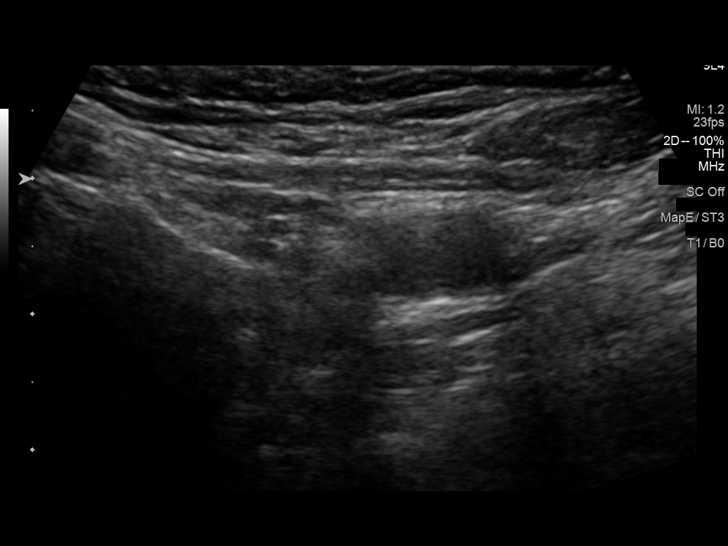
[im 34/38]
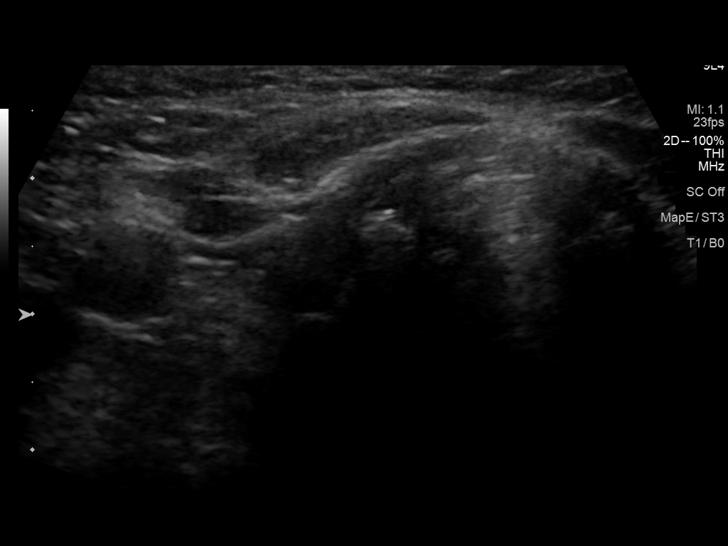
[im 38/38]
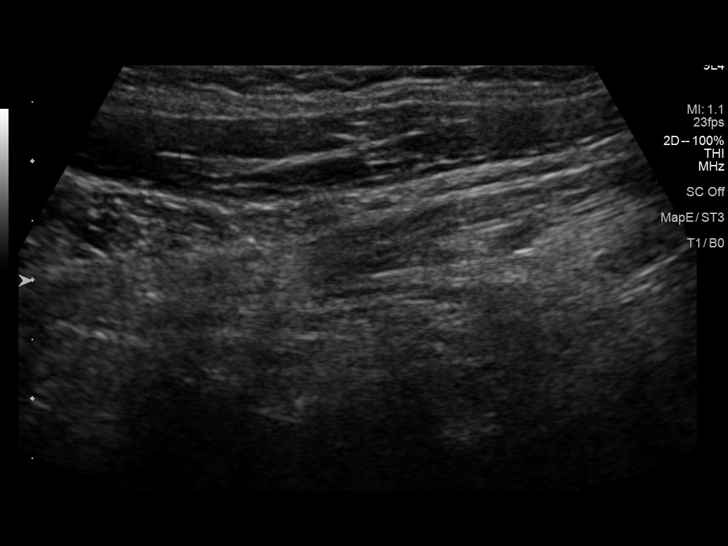

[13 of 25 positions shown; findings below may reference images not displayed]

FINDINGS: Parenchymal Echotexture: Moderately heterogenous

Isthmus: 6 mm

Right lobe: 5.4 x 2.2 x 2.8 cm

Left lobe: 5.4 x 1.9 x 1.9 cm

_________________________________________________________

Estimated total number of nodules >/= 1 cm: 2

Number of spongiform nodules >/=  2 cm not described below (TR1): 0

Number of mixed cystic and solid nodules >/= 1.5 cm not described
below (TR2): 0

_________________________________________________________

Nodule # 1:

Location: Right; Inferior

Maximum size: 2.6 cm; Other 2 dimensions: 2.5 x 1.8 cm

Composition: solid/almost completely solid (2)

Echogenicity: isoechoic (1)

Shape: not taller-than-wide (0)

Margins: smooth (0)

Echogenic foci: none (0)

ACR TI-RADS total points: 3.

ACR TI-RADS risk category: TR3 (3 points).

ACR TI-RADS recommendations:

**Given size (>/= 2.5 cm) and appearance, fine needle aspiration of
this mildly suspicious nodule should be considered based on TI-RADS
criteria.

_________________________________________________________

Nodule # 2:

Location: Left; Superior

Maximum size: 2.2 cm; Other 2 dimensions: 2.1 x 1.6 cm

Composition: solid/almost completely solid (2)

Echogenicity: isoechoic (1)

Shape: not taller-than-wide (0)

Margins: smooth (0)

Echogenic foci: none (0)

ACR TI-RADS total points: 3.

ACR TI-RADS risk category: TR3 (3 points).

ACR TI-RADS recommendations:

*Given size (>/= 1.5 - 2.4 cm) and appearance, a follow-up
ultrasound in 1 year should be considered based on TI-RADS criteria.

_________________________________________________________

Dystrophic calcifications in the right midpole measure 6 mm.

No hypervascularity.  No regional adenopathy.
IMPRESSION: 2.6 cm right inferior TR 3 nodule meets criteria for biopsy as
above.

2.2 left superior TR 3 nodule meets criteria for follow-up in 1
year.

The above is in keeping with the ACR TI-RADS recommendations - [HOSPITAL] [id number];[DATE].

## 2022-09-24 ENCOUNTER — Encounter: Payer: No Typology Code available for payment source | Admitting: Internal Medicine

## 2024-05-31 DIAGNOSIS — E7800 Pure hypercholesterolemia, unspecified: Secondary | ICD-10-CM

## 2024-06-10 ENCOUNTER — Other Ambulatory Visit (HOSPITAL_BASED_OUTPATIENT_CLINIC_OR_DEPARTMENT_OTHER)

## 2024-06-16 ENCOUNTER — Other Ambulatory Visit (HOSPITAL_BASED_OUTPATIENT_CLINIC_OR_DEPARTMENT_OTHER)

## 2024-06-24 ENCOUNTER — Encounter (HOSPITAL_BASED_OUTPATIENT_CLINIC_OR_DEPARTMENT_OTHER): Payer: Self-pay

## 2024-06-24 ENCOUNTER — Ambulatory Visit (HOSPITAL_BASED_OUTPATIENT_CLINIC_OR_DEPARTMENT_OTHER)

## 2024-06-25 ENCOUNTER — Ambulatory Visit (HOSPITAL_BASED_OUTPATIENT_CLINIC_OR_DEPARTMENT_OTHER)
